# Patient Record
Sex: Female | Born: 1935 | Race: White | Hispanic: No | Marital: Single | Smoking: Never smoker
Health system: Southern US, Community
[De-identification: ages and names within clinical notes are randomized; demographics above are authoritative.]

## PROBLEM LIST (undated history)

## (undated) DIAGNOSIS — I152 Hypertension secondary to endocrine disorders: Secondary | ICD-10-CM

## (undated) DIAGNOSIS — I428 Other cardiomyopathies: Secondary | ICD-10-CM

## (undated) DIAGNOSIS — I5022 Chronic systolic (congestive) heart failure: Secondary | ICD-10-CM

## (undated) DIAGNOSIS — E119 Type 2 diabetes mellitus without complications: Secondary | ICD-10-CM

## (undated) DIAGNOSIS — E785 Hyperlipidemia, unspecified: Secondary | ICD-10-CM

## (undated) DIAGNOSIS — E1159 Type 2 diabetes mellitus with other circulatory complications: Secondary | ICD-10-CM

## (undated) DIAGNOSIS — E1169 Type 2 diabetes mellitus with other specified complication: Secondary | ICD-10-CM

## (undated) DIAGNOSIS — Z95810 Presence of automatic (implantable) cardiac defibrillator: Secondary | ICD-10-CM

## (undated) DIAGNOSIS — I447 Left bundle-branch block, unspecified: Secondary | ICD-10-CM

## (undated) HISTORY — PX: TOTAL ABDOMINAL HYSTERECTOMY W/ BILATERAL SALPINGOOPHORECTOMY: SHX83

---

## 2009-07-06 ENCOUNTER — Encounter: Admission: RE | Admit: 2009-07-06 | Discharge: 2009-07-06 | Payer: Self-pay | Admitting: Family Medicine

## 2010-07-11 ENCOUNTER — Encounter: Payer: Self-pay | Admitting: Cardiology

## 2010-07-11 NOTE — Telephone Encounter (Signed)
Error

## 2014-11-29 ENCOUNTER — Other Ambulatory Visit (HOSPITAL_COMMUNITY): Payer: Self-pay | Admitting: Physician Assistant

## 2014-11-29 DIAGNOSIS — R928 Other abnormal and inconclusive findings on diagnostic imaging of breast: Secondary | ICD-10-CM

## 2014-12-13 ENCOUNTER — Encounter (HOSPITAL_COMMUNITY): Payer: Self-pay

## 2014-12-27 ENCOUNTER — Ambulatory Visit (HOSPITAL_COMMUNITY)
Admission: RE | Admit: 2014-12-27 | Discharge: 2014-12-27 | Disposition: A | Discharge status: Home / Self Care | Payer: Medicare Other | Source: Ambulatory Visit | Attending: Physician Assistant | Admitting: Physician Assistant

## 2014-12-27 ENCOUNTER — Other Ambulatory Visit (HOSPITAL_COMMUNITY): Payer: Self-pay | Admitting: Physician Assistant

## 2014-12-27 DIAGNOSIS — N63 Unspecified lump in breast: Secondary | ICD-10-CM | POA: Insufficient documentation

## 2014-12-27 DIAGNOSIS — R928 Other abnormal and inconclusive findings on diagnostic imaging of breast: Secondary | ICD-10-CM

## 2014-12-27 DIAGNOSIS — N6320 Unspecified lump in the left breast, unspecified quadrant: Secondary | ICD-10-CM

## 2015-01-03 ENCOUNTER — Other Ambulatory Visit (HOSPITAL_COMMUNITY): Payer: Self-pay | Admitting: Family Medicine

## 2015-01-03 ENCOUNTER — Ambulatory Visit (HOSPITAL_COMMUNITY)
Admission: RE | Admit: 2015-01-03 | Discharge: 2015-01-03 | Disposition: A | Discharge status: Home / Self Care | Payer: Medicare Other | Source: Ambulatory Visit | Attending: Physician Assistant | Admitting: Physician Assistant

## 2015-01-03 ENCOUNTER — Ambulatory Visit (HOSPITAL_COMMUNITY)
Admission: RE | Admit: 2015-01-03 | Discharge: 2015-01-03 | Disposition: A | Discharge status: Home / Self Care | Payer: Medicare Other | Source: Ambulatory Visit | Attending: Family Medicine | Admitting: Family Medicine

## 2015-01-03 DIAGNOSIS — N6320 Unspecified lump in the left breast, unspecified quadrant: Secondary | ICD-10-CM

## 2015-01-03 DIAGNOSIS — C50412 Malignant neoplasm of upper-outer quadrant of left female breast: Secondary | ICD-10-CM | POA: Diagnosis not present

## 2015-01-03 DIAGNOSIS — Z170 Estrogen receptor positive status [ER+]: Secondary | ICD-10-CM | POA: Insufficient documentation

## 2015-01-03 DIAGNOSIS — N63 Unspecified lump in breast: Secondary | ICD-10-CM | POA: Diagnosis present

## 2015-01-03 DIAGNOSIS — R928 Other abnormal and inconclusive findings on diagnostic imaging of breast: Secondary | ICD-10-CM

## 2015-01-03 MED ORDER — LIDOCAINE HCL (PF) 2 % IJ SOLN
INTRAMUSCULAR | Status: AC
  Filled 2015-01-03: qty 10

## 2015-01-03 NOTE — Discharge Instructions (Signed)
Breast Biopsy, Care After °These instructions give you information on caring for yourself after your procedure. Your doctor may also give you more specific instructions. Call your doctor if you have any problems or questions after your procedure. °HOME CARE °· Only take medicine as told by your doctor. °· Do not take aspirin. °· Keep your sutures (stitches) dry when bathing. °· Protect the biopsy area. Do not let the area get bumped. °· Avoid activities that could pull the biopsy site open until your doctor approves. This includes: °· Stretching. °· Reaching. °· Exercise. °· Sports. °· Lifting more than 3lb. °· Continue your normal diet. °· Wear a good support bra for as long as told by your doctor. °· Change any bandages (dressings) as told by your doctor. °· Do not drink alcohol while taking pain medicine. °· Keep all doctor visits as told. Ask when your test results will be ready. Make sure you get your test results. °GET HELP RIGHT AWAY IF:  °· You have a fever. °· You have more bleeding (more than a small spot) from the biopsy site. °· You have trouble breathing. °· You have yellowish-white fluid (pus) coming from the biopsy site. °· You have redness, puffiness (swelling), or more pain in the biopsy site. °· You have a bad smell coming from the biopsy site. °· Your biopsy site opens after sutures, staples, or sticky strips have been removed. °· You have a rash. °· You need stronger medicine. °MAKE SURE YOU: °· Understand these instructions. °· Will watch your condition. °· Will get help right away if you are not doing well or get worse. °  °This information is not intended to replace advice given to you by your health care provider. Make sure you discuss any questions you have with your health care provider. °  °Document Released: 11/24/2008 Document Revised: 02/18/2014 Document Reviewed: 03/10/2011 °Elsevier Interactive Patient Education ©2016 Elsevier Inc. ° °Breast Biopsy °A breast biopsy is a test during  which a sample of tissue is taken from your breast. The breast tissue is looked at under a microscope for cancer cells.  °BEFORE THE PROCEDURE °· Make plans to have someone drive you home after the test. °· Do not smoke for 2 weeks before the test. Stop smoking, if you smoke. °· Do not drink alcohol for 24 hours before the test. °· Wear a good support bra to the test. °· Your doctor may do a procedure to place a wire or a seed that gives off radiation in the breast lump. The wire or seed will help your doctor see the lump when doing the biopsy. °PROCEDURE  °You may be given one of the following: °· A medicine to numb the breast area (local anesthetic). °· A medicine to make you fall asleep (general anesthetic). °There are different types of breast biopsies. They include: °· Fine-needle aspiration. °¨ A needle is put into the breast lump. °¨ The needle takes out fluid and cells from the lump. °¨ Ultrasound imaging may be used to help find the lump and to put the needle in the right spot. °· Core-needle biopsy. °¨ A needle is put into the breast lump. °¨ The needle is put in your breast 3-6 times. °¨ The needle removes breast tissue. °¨ An ultrasound image or X-ray is often used to find the right spot to put in the needle. °· Stereotactic biopsy. °¨ X-rays and a computer are used to study X-ray pictures of the breast lump. °¨ The computer finds   where the needle needs to be put into the breast. °¨ Tissue samples are taken out. °· Vacuum-assisted biopsy. °¨ A small cut (incision) is made in your breast. °¨ A biopsy device is put through the cut and into the breast tissue. °¨ The biopsy device draws abnormal breast tissue into the biopsy device. °¨ A large tissue sample is often removed. °¨ No stitches are needed. °· Ultrasound-guided core-needle biopsy. °¨ Ultrasound imaging helps guide the needle into the area of the breast that is not normal. °¨ A cut is made in the breast. The needle is put into the breast  lump. °¨ Tissue samples are taken out. °· Open biopsy. °¨ A large cut is made in the breast. °¨ Your doctor will try to remove the whole breast lump or as much as possible. °All tissue, fluid, or cell samples are looked at under a microscope.  °AFTER THE PROCEDURE °· You will be taken to an area to recover. You will be able to go home once you are doing well and are without problems. °· You may have bruising on your breast. This is normal. °· A pressure bandage (dressing) may be put on your breast for 24-48 hours. This type of bandage is wrapped tightly around your chest. It helps stop fluid from building up underneath tissues. °  °This information is not intended to replace advice given to you by your health care provider. Make sure you discuss any questions you have with your health care provider. °  °Document Released: 04/22/2011 Document Revised: 10/19/2014 Document Reviewed: 04/22/2011 °Elsevier Interactive Patient Education ©2016 Elsevier Inc. ° °

## 2019-03-09 ENCOUNTER — Other Ambulatory Visit (HOSPITAL_COMMUNITY): Payer: Self-pay | Admitting: Family

## 2019-03-09 DIAGNOSIS — Z1231 Encounter for screening mammogram for malignant neoplasm of breast: Secondary | ICD-10-CM

## 2019-05-12 ENCOUNTER — Ambulatory Visit (HOSPITAL_COMMUNITY): Payer: Medicare Other

## 2019-05-19 ENCOUNTER — Ambulatory Visit (HOSPITAL_COMMUNITY): Payer: Medicare Other

## 2019-09-07 ENCOUNTER — Telehealth: Payer: Self-pay | Admitting: Nurse Practitioner

## 2019-09-07 NOTE — Telephone Encounter (Signed)
Called patient's daughter [name], to schedule the Palliative Consult, no answer - left message with reason for call along with my name and contact number.

## 2019-09-10 ENCOUNTER — Telehealth: Payer: Self-pay | Admitting: Nurse Practitioner

## 2019-09-10 NOTE — Telephone Encounter (Signed)
Called patient's daughter, [name], to schedule the Palliative Consult, no answer and unable to leave message due to mailbox was full.  I then called patient's home number, no answer and it rang several times but no voicemail.

## 2019-09-16 ENCOUNTER — Telehealth: Payer: Self-pay | Admitting: Nurse Practitioner

## 2019-09-16 NOTE — Telephone Encounter (Signed)
I called patient's home number and was able to speak with the daughter [name], regarding Palliative services and all questions were answered and she was in agreement with beginning Palliative services for patient.  I have scheduled an In-person Consult for 10/05/19 @ 3:15 PM.

## 2019-09-16 NOTE — Telephone Encounter (Signed)
Rec;d message that daughter had called on 8/4 and requested that I call her back to schedule Palliative Consult.  Called daughter back with no answer and unable to leave a message due to mailbox was full.

## 2019-10-05 ENCOUNTER — Encounter: Payer: Self-pay | Admitting: Nurse Practitioner

## 2019-10-05 ENCOUNTER — Other Ambulatory Visit: Payer: Self-pay

## 2019-10-05 ENCOUNTER — Other Ambulatory Visit: Payer: Medicare Other | Admitting: Nurse Practitioner

## 2019-10-05 DIAGNOSIS — F0391 Unspecified dementia with behavioral disturbance: Secondary | ICD-10-CM

## 2019-10-05 DIAGNOSIS — Z515 Encounter for palliative care: Secondary | ICD-10-CM

## 2019-10-05 NOTE — Progress Notes (Signed)
[facility] Consult Note Telephone: [phone]  Fax: [phone]  PATIENT NAME: [name] DOB: 01-Mar-1935 MRN: [id number]  PRIMARY CARE PROVIDER:   Vesta Mixer  REFERRING PROVIDER:  Antionette Fairy, PA-C [address] [place],  [place] [address]  RESPONSIBLE PARTY:   Darielys Giglia daughter [phone] or [phone]  RECOMMENDATIONS and PLAN:  1. ACP: Made DNR/placed in Vynca; blank MOST form left for further review with family will revisit at next [facility] visit   2. Palliative care encounter; Palliative medicine team will continue to support patient, patient's family, and medical team. Visit consisted of counseling and education dealing with the complex and emotionally intense issues of symptom management and palliative care in the setting of serious and potentially life-threatening illness\  3. F/u 2 months for ongoing discussion goc, complete MOST form or sooner if family agree to complete, monitor progression of dementia  I spent 90 minutes providing this consultation,  from 3:00pmpm to 4:30pm. More than 50% of the time in this consultation was spent coordinating communication.   HISTORY OF PRESENT ILLNESS:  Kathryn Sloan is a 84 y.o. year old female with multiple medical problems including dementia, intraductal carcinoma. I was asked to see Kathryn Sloan for palliative care consult for goals of care. Initial in-person visit with Kathryn Sloan and her daughter [name] for and [facility] palliative care consult four goals of care. I met with Kathryn Sloan and [name]. We talked about purpose of palliative care visit. [name] in agreement. We talked about past medical history in the setting of chronic disease progression. We talked about Kathryn Sloan husband passing away several years ago for which he received CPR in the home that Ms Sloan witnessed as he was being admitted the hospice he was just discharged from the hospital. We talked about medical  goals of care including aggressive versus conservative versus comfort care. We reviewed most form and will leave a blank copy for [name] to review with her siblings. We talked about code status. Ms Halley endorses she would not want to have CPR. [name] in agreement. [place] form completed. Will put in Vynca/Epic. We talked about Life review. We talked about Ms Sloan working in [employer] in [place] bought her kids were in school. She had two girls and three boys. [name], her daughter who currently resides with her, a son play who lives in [place] and helps with all finances. Her other daughter lives in [place]. [name] youngest brother has passed. [name] other son [name] comes and goes though not reliable. We talked about Ms Sloan functional abilities. Kathryn Sloan is ambulatory with no recent fall. Kathryn Sloan does require assistance with bathing but is able to dress herself. Kathryn Sloan is incontinent of urine and wears adult diapers. [name] endorses that time she does have diarrhea for what she gives her one Lomotil which does control it are there has episodes. Ms Sloan does not appear to be in pain or short of breath. Kathryn Sloan does have cognitive impairment mild. We talked at length about behaviors. [name] endorses they were at the beach and she walked off it was very difficult to get her back in the car. [name] endorses an hour later she did not realize what happened. [name] endorses there were two other incidents where she got mad and left the house walking up the road. One time it took [place] 2 hours to get her out of the car. We talked about  the 25 mg of Seroquel that was started at bedtime. [name] endorses that it does make her sleepy but it seems to be improving behaviors. We talked about the option of possibly adding Seroquel 12.5 mg in the morning after a few weeks once she gets used to the Seroquel. We talked about [place] having a Psychiatric Nurse Practitioner and that it may  be wise to do a visit for behaviors with worsening dementia. We talked about chronic disease progression of dementia. We talked about each day being different. We talked about redirecting behaviors. We talked about coping strategies. We talked about caregiver stress and fatigue. We also talked about PACE program with Adult Day [facility] as well as [facility] for more information concerning their day programs. We talked about [place] for medical and financial. During the palliative care visit [name] text her brother in [place] and they were both in agreement that needs to be done. We talked about the dementia piece, before dementia progression so Kathryn Sloan can  decides who she wants for this role. We talked about role of palliative care and plan of care. We talked about palliative care for ongoing monitoring of dementia with slow progression, monitoring, , behaviors, supportive role.We talked about follow up palliative care visit into must have needed her sooner should she declined. [name] in agreement, appointment scheduled. Therapeutic listening, emotional support provided. They contact information. Questions answered to satisfaction.  Palliative Care was asked to help address goals of care.   CODE STATUS: DNR  PPS: 50% HOSPICE ELIGIBILITY/DIAGNOSIS: TBD  PAST MEDICAL HISTORY: Dementia, intraductal carcimoma Social History   Tobacco Use  . Smoking status: Not on file  Substance Use Topics  . Alcohol use: Not on file    ALLERGIES: No Known Allergies   PERTINENT MEDICATIONS:  No outpatient encounter medications on file as of 10/05/2019.   No facility-administered encounter medications on file as of 10/05/2019.    PHYSICAL EXAM:   General: NAD, mildly confused pleasant female Cardiovascular: regular rate and rhythm Pulmonary: clear ant fields Neurological: ambulatory  Ameliarose Shark Ihor Gully, NP

## 2019-10-20 ENCOUNTER — Telehealth: Payer: Self-pay

## 2019-10-20 NOTE — Telephone Encounter (Signed)
Palliative care volunteer support call made, patient doing ok

## 2019-11-09 ENCOUNTER — Other Ambulatory Visit: Payer: Self-pay

## 2019-11-09 ENCOUNTER — Other Ambulatory Visit: Payer: Medicare Other | Admitting: Nurse Practitioner

## 2019-11-09 ENCOUNTER — Telehealth: Payer: Self-pay | Admitting: Nurse Practitioner

## 2019-11-09 NOTE — Telephone Encounter (Signed)
I called [name], Ms. Tippy's daughter about f/u PC visit today, [name] returned my call. [name] endorses she forgot about the appointment plus she has covid, rescheduled per request. [name] endorses Ms. Flesch is doing well. Contact information

## 2019-12-11 ENCOUNTER — Emergency Department (HOSPITAL_COMMUNITY): Payer: Medicare Other

## 2019-12-11 ENCOUNTER — Emergency Department (HOSPITAL_COMMUNITY)
Admission: EM | Admit: 2019-12-11 | Discharge: 2019-12-11 | Disposition: A | Discharge status: Home / Self Care | Payer: Medicare Other | Attending: Emergency Medicine | Admitting: Emergency Medicine

## 2019-12-11 ENCOUNTER — Other Ambulatory Visit: Payer: Self-pay

## 2019-12-11 ENCOUNTER — Encounter (HOSPITAL_COMMUNITY): Payer: Self-pay | Admitting: Emergency Medicine

## 2019-12-11 DIAGNOSIS — E119 Type 2 diabetes mellitus without complications: Secondary | ICD-10-CM | POA: Insufficient documentation

## 2019-12-11 DIAGNOSIS — W010XXA Fall on same level from slipping, tripping and stumbling without subsequent striking against object, initial encounter: Secondary | ICD-10-CM | POA: Diagnosis not present

## 2019-12-11 DIAGNOSIS — S79911A Unspecified injury of right hip, initial encounter: Secondary | ICD-10-CM | POA: Diagnosis present

## 2019-12-11 DIAGNOSIS — S32511A Fracture of superior rim of right pubis, initial encounter for closed fracture: Secondary | ICD-10-CM | POA: Diagnosis not present

## 2019-12-11 DIAGNOSIS — S32591A Other specified fracture of right pubis, initial encounter for closed fracture: Secondary | ICD-10-CM

## 2019-12-11 HISTORY — DX: Type 2 diabetes mellitus without complications: E11.9

## 2019-12-11 MED ORDER — HYDROCODONE-ACETAMINOPHEN 5-325 MG PO TABS: ORAL_TABLET | ORAL | 0 refills | Status: DC

## 2019-12-11 NOTE — Discharge Instructions (Addendum)
The CT of your hip today shows that you have a nondisplaced fracture of your pelvic bone.  These areas should heal and several weeks.  She will need to use a walker for support when standing and walking.  She has been prescribed pain medication that should help with her discomfort, but be aware this may cause drowsiness and can cause constipation.  You may want to give her a stool softener while taking the hydrocodone.  Call her primary care provider to arrange follow-up or you may contact the orthopedic provider listed to arrange a follow-up in 1 to 2 weeks.  Return to the emergency department for any worsening symptoms.

## 2019-12-11 NOTE — ED Provider Notes (Signed)
[facility] EMERGENCY DEPARTMENT Provider Note   CSN: [id number] Arrival date & time: 12/11/19  1359     History Chief Complaint  Patient presents with  . Fall    Kathryn Sloan is a 84 y.o. female.  HPI      Kathryn Sloan is a 83 y.o. female with past medical history of type II DM who presents to the Emergency Department complaining of pain to her right hip and groin secondary to a fall that occurred 3 days ago.  She describes a mechanical fall in which her left foot slid forward and she fell back landing on her buttocks and right hip.  No proceeding symptoms.  She was seen at the emergency department in [place] and had an inconclusive plain film x-ray of her right hip and was recommended to have CT scan of her hip, but patient became impatient and left.  Patient and family member states that she continues to have pain that is sharp and severe with movement of her right hip and standing or walking.  She states the pain to her right groin is worse when stepping.  She denies head injury, back pain, LOC, headache, vomiting, visual changes, neck pain and numbness, or weakness of the right leg distal to the thigh.  No dysuria, hematuria, urine or bowel changes.  She has been taking extra strength Tylenol with some relief.  She denies excessive bruising, swelling or wounds of the hip or back.   Past Medical History:  Diagnosis Date  . Diabetes mellitus without complication ([place])     There are no problems to display for this patient.   History reviewed. No pertinent surgical history.   OB History   No obstetric history on file.     No family history on file.  Social History   Tobacco Use  . Smoking status: Not on file  Substance Use Topics  . Alcohol use: Not on file  . Drug use: Not on file    Home Medications Prior to Admission medications   Not on File    Allergies    Patient has no known allergies.  Review of Systems   Review of Systems    Constitutional: Negative for chills and fever.  Respiratory: Negative for chest tightness and shortness of breath.   Cardiovascular: Negative for chest pain and leg swelling.  Gastrointestinal: Negative for abdominal pain, nausea and vomiting.  Genitourinary: Negative for decreased urine volume, difficulty urinating, dysuria, flank pain and hematuria.  Musculoskeletal: Positive for arthralgias (right hip pain). Negative for joint swelling and neck pain.  Skin: Negative for color change and wound.  Neurological: Negative for dizziness, syncope, speech difficulty, weakness, numbness and headaches.    Physical Exam Updated Vital Signs BP 115/61 (BP Location: Right Arm)   Pulse 64   Temp 98.5 F (36.9 C) (Oral)   Resp 17   SpO2 94%   Physical Exam Vitals and nursing note reviewed.  Constitutional:      General: She is not in acute distress.    Appearance: Normal appearance. She is not ill-appearing.  HENT:     Head: Atraumatic.     Comments: No scalp tenderness or hematomas    Mouth/Throat:     Mouth: Mucous membranes are moist.  Eyes:     Conjunctiva/sclera: Conjunctivae normal.     Pupils: Pupils are equal, round, and reactive to light.  Cardiovascular:     Rate and Rhythm: Normal rate and regular rhythm.  Pulses: Normal pulses.  Pulmonary:     Effort: Pulmonary effort is normal.  Chest:     Chest wall: No tenderness.  Abdominal:     General: There is no distension.     Palpations: Abdomen is soft.     Tenderness: There is no abdominal tenderness. There is no left CVA tenderness.  Musculoskeletal:        General: Tenderness and signs of injury present. No swelling or deformity.     Cervical back: Normal range of motion. No tenderness.     Comments: Patient able to fully rotate the right hip w/o difficulty or pain.  No bony deformity or edema.  Few small area of ecchymosis of the right inner thigh.    Skin:    General: Skin is warm.     Capillary Refill: Capillary  refill takes less than 2 seconds.     Findings: No rash.  Neurological:     General: No focal deficit present.     Mental Status: She is alert.     Sensory: No sensory deficit.     Motor: No weakness.     ED Results / Procedures / Treatments   Labs (all labs ordered are listed, but only abnormal results are displayed) Labs Reviewed - No data to display  EKG None  Radiology CT Lumbar Spine Wo Contrast  Result Date: 12/11/2019 CLINICAL DATA:  Back pain.  Trauma 3 weeks ago. EXAM: CT LUMBAR SPINE WITHOUT CONTRAST TECHNIQUE: Multidetector CT imaging of the lumbar spine was performed without intravenous contrast administration. Multiplanar CT image reconstructions were also generated. COMPARISON:  None. FINDINGS: Segmentation: There are five lumbar type vertebral bodies. The last full intervertebral disc space is labeled L5-S1. Alignment: Grade 1 spondylolisthesis at L5 due to bilateral pars defects. There is also degenerative anterolisthesis of L4. Mild posterior listhesis of L1. Vertebrae: Age related osteoporosis but no acute fracture or worrisome bone lesions. Paraspinal and other soft tissues: Advanced aortic calcifications but no aneurysm. No retroperitoneal mass or adenopathy. Disc levels: T12-L1: No significant findings. L1-2: Bulging annulus and moderate facet disease but no significant spinal or foraminal stenosis. L2-3: Bulging annulus and advanced facet disease contributing to mild spinal and bilateral lateral recess stenosis. No significant foraminal stenosis. L3-4: Right-sided laminectomy defect is noted. Diffuse bulging annulus and advanced facet disease but no significant spinal or foraminal stenosis. L4-5: Advanced disc disease and facet disease without significant spinal or foraminal stenosis. L5-S1: Bulging uncovered disc and severe facet disease. Mild spinal, lateral recess and mild to moderate foraminal stenosis bilaterally. IMPRESSION: 1. Grade 1 spondylolisthesis at L5 due to  bilateral pars defects. There is also degenerative anterolisthesis of L4. 2. Mild spinal, lateral recess and mild to moderate foraminal stenosis bilaterally at L5-S1. 3. Mild spinal and bilateral lateral recess stenosis at L2-3. 4. Right-sided laminectomy defect at L3-4. 5. Aortic atherosclerosis. Aortic Atherosclerosis (ICD10-I70.0). Electronically Signed   By: Marijo Sanes M.D.   On: 12/11/2019 15:54   CT Hip Right Wo Contrast  Result Date: 12/11/2019 CLINICAL DATA:  Right hip pain for 3 weeks.  History of trauma. EXAM: CT OF THE RIGHT HIP WITHOUT CONTRAST TECHNIQUE: Multidetector CT imaging of the right hip was performed according to the standard protocol. Multiplanar CT image reconstructions were also generated. COMPARISON:  None. FINDINGS: The right hip is normally located. No hip fracture or evidence of AVN. Mild to moderate hip joint degenerative changes. There are nondisplaced fractures involving the superior and inferior pubic rami. Moderate degenerative changes  at the pubic symphysis. Enthesophytes noted at the ischial tuberosity and greater trochanter. The hip and pelvic musculature is grossly normal. No obvious muscle tear or intramuscular hematoma. Age related atherosclerotic calcifications are noted. No significant intrapelvic abnormalities are identified. IMPRESSION: 1. Nondisplaced fractures involving the superior and inferior pubic rami. 2. No hip fracture or evidence of AVN. 3. Mild to moderate hip joint degenerative changes. Electronically Signed   By: Marijo Sanes M.D.   On: 12/11/2019 15:48    Procedures Procedures (including critical care time)  Medications Ordered in ED Medications - No data to display  ED Course  I have reviewed the triage vital signs and the nursing notes.  Pertinent labs & imaging results that were available during my care of the patient were reviewed by me and considered in my medical decision making (see chart for details).    MDM  Rules/Calculators/A&P                           Patient here for CT of the right hip secondary to pain of her right hip and groin from a mechanical fall that occurred 3 days ago.  No head injury or LOC. Denies any other injuries.  Accompanied by her daughter who is also her caregiver and lives with her mother.  She was initially seen after the fall at another ER but became upset over the wait time, and left prior to receiving the CT of her hip.  Describes pain associated only with standing, walking.  Resolves at rest.    On my exam, she has full ROM of the right hip.  No bony deformities or edema.  NV intact and she is able to bear weight and make small steps.  CT of the hip shows nondisplaced inferior and superior pubic rami fx's.  Dicussed these finding with pt and family.  Since pt is able to bear wt, has walker at home and has family member at home that care for her, I feel that pt is appropriate for d/c home.  Pt agreeable to close orthopedic f/u, prefers local ortho f/u.  return precautions given.     Final Clinical Impression(s) / ED Diagnoses Final diagnoses:  Closed fracture of right inferior pubic ramus, initial encounter ([place])  Closed fracture of superior ramus of right pubis, initial encounter [facility])    Rx / [place] Orders ED Discharge Orders    None       Kem Parkinson, PA-C 12/13/19 1411    Sherwood Gambler, MD 12/15/19 [phone]

## 2019-12-11 NOTE — ED Triage Notes (Signed)
Pt reports she fell flat on her back Wednesday. Denies hitting her head. Daughter reports that pt was seen in [place] and had an x-ray on her right hip but was told they needed a CT scan. Pt still reports pain to the right upper leg/hip.

## 2019-12-19 ENCOUNTER — Observation Stay (HOSPITAL_BASED_OUTPATIENT_CLINIC_OR_DEPARTMENT_OTHER)
Admission: EM | Admit: 2019-12-19 | Discharge: 2019-12-20 | Disposition: A | Discharge status: Home / Self Care | Payer: Medicare Other | Source: Home / Self Care | Attending: Emergency Medicine | Admitting: Emergency Medicine

## 2019-12-19 ENCOUNTER — Other Ambulatory Visit: Payer: Self-pay

## 2019-12-19 ENCOUNTER — Emergency Department (HOSPITAL_COMMUNITY): Payer: Medicare Other

## 2019-12-19 ENCOUNTER — Encounter (HOSPITAL_COMMUNITY): Payer: Self-pay

## 2019-12-19 DIAGNOSIS — J189 Pneumonia, unspecified organism: Secondary | ICD-10-CM | POA: Diagnosis not present

## 2019-12-19 DIAGNOSIS — R042 Hemoptysis: Secondary | ICD-10-CM | POA: Diagnosis present

## 2019-12-19 DIAGNOSIS — Z794 Long term (current) use of insulin: Secondary | ICD-10-CM | POA: Insufficient documentation

## 2019-12-19 DIAGNOSIS — F0390 Unspecified dementia without behavioral disturbance: Secondary | ICD-10-CM | POA: Insufficient documentation

## 2019-12-19 DIAGNOSIS — J9601 Acute respiratory failure with hypoxia: Secondary | ICD-10-CM | POA: Diagnosis not present

## 2019-12-19 DIAGNOSIS — Z7984 Long term (current) use of oral hypoglycemic drugs: Secondary | ICD-10-CM | POA: Insufficient documentation

## 2019-12-19 DIAGNOSIS — R791 Abnormal coagulation profile: Secondary | ICD-10-CM | POA: Insufficient documentation

## 2019-12-19 DIAGNOSIS — Z7982 Long term (current) use of aspirin: Secondary | ICD-10-CM | POA: Insufficient documentation

## 2019-12-19 DIAGNOSIS — R102 Pelvic and perineal pain: Secondary | ICD-10-CM

## 2019-12-19 DIAGNOSIS — E119 Type 2 diabetes mellitus without complications: Secondary | ICD-10-CM | POA: Insufficient documentation

## 2019-12-19 DIAGNOSIS — I4891 Unspecified atrial fibrillation: Secondary | ICD-10-CM | POA: Insufficient documentation

## 2019-12-19 DIAGNOSIS — Z7901 Long term (current) use of anticoagulants: Secondary | ICD-10-CM | POA: Insufficient documentation

## 2019-12-19 DIAGNOSIS — Z20822 Contact with and (suspected) exposure to covid-19: Secondary | ICD-10-CM | POA: Insufficient documentation

## 2019-12-19 LAB — COMPREHENSIVE METABOLIC PANEL
ALT: 23 U/L (ref 0–44)
AST: 23 U/L (ref 15–41)
Albumin: 3.2 g/dL — ABNORMAL LOW (ref 3.5–5.0)
Alkaline Phosphatase: 361 U/L — ABNORMAL HIGH (ref 38–126)
Anion gap: 9 (ref 5–15)
BUN: 36 mg/dL — ABNORMAL HIGH (ref 8–23)
CO2: 22 mmol/L (ref 22–32)
Calcium: 9 mg/dL (ref 8.9–10.3)
Chloride: 104 mmol/L (ref 98–111)
Creatinine, Ser: 1.04 mg/dL — ABNORMAL HIGH (ref 0.44–1.00)
GFR, Estimated: 54 mL/min — ABNORMAL LOW (ref >60)
Glucose, Bld: 185 mg/dL — ABNORMAL HIGH (ref 70–99)
Potassium: 3.9 mmol/L (ref 3.5–5.1)
Sodium: 135 mmol/L (ref 135–145)
Total Bilirubin: 1.7 mg/dL — ABNORMAL HIGH (ref 0.3–1.2)
Total Protein: 6.6 g/dL (ref 6.5–8.1)

## 2019-12-19 LAB — CBC WITH DIFFERENTIAL/PLATELET
Abs Immature Granulocytes: 0.06 10*3/uL (ref 0.00–0.07)
Basophils Absolute: 0 10*3/uL (ref 0.0–0.1)
Basophils Relative: 0 %
Eosinophils Absolute: 0.1 10*3/uL (ref 0.0–0.5)
Eosinophils Relative: 1 %
HCT: 35.6 % — ABNORMAL LOW (ref 36.0–46.0)
Hemoglobin: 11.7 g/dL — ABNORMAL LOW (ref 12.0–15.0)
Immature Granulocytes: 1 %
Lymphocytes Relative: 9 %
Lymphs Abs: 1.2 10*3/uL (ref 0.7–4.0)
MCH: 29.9 pg (ref 26.0–34.0)
MCHC: 32.9 g/dL (ref 30.0–36.0)
MCV: 91 fL (ref 80.0–100.0)
Monocytes Absolute: 0.9 10*3/uL (ref 0.1–1.0)
Monocytes Relative: 7 %
Neutro Abs: 10.1 10*3/uL — ABNORMAL HIGH (ref 1.7–7.7)
Neutrophils Relative %: 82 %
Platelets: 236 10*3/uL (ref 150–400)
RBC: 3.91 MIL/uL (ref 3.87–5.11)
RDW: 14.5 % (ref 11.5–15.5)
WBC: 12.4 10*3/uL — ABNORMAL HIGH (ref 4.0–10.5)
nRBC: 0 % (ref 0.0–0.2)

## 2019-12-19 LAB — PROTIME-INR
INR: >10 (ref 0.8–1.2)
Prothrombin Time: >90 seconds — ABNORMAL HIGH (ref 11.4–15.2)

## 2019-12-19 LAB — RESPIRATORY PANEL BY RT PCR (FLU A&B, COVID)
Influenza A by PCR: NEGATIVE
Influenza B by PCR: NEGATIVE
SARS Coronavirus 2 by RT PCR: NEGATIVE

## 2019-12-19 MED ORDER — HYDROCODONE-ACETAMINOPHEN 5-325 MG PO TABS
1.0000 | ORAL_TABLET | Freq: Four times a day (QID) | ORAL | Status: DC | PRN
  Administered 2019-12-20: 1 via ORAL
  Filled 2019-12-19: qty 1

## 2019-12-19 MED ORDER — ACETAMINOPHEN 650 MG RE SUPP: 650.0000 mg | Freq: Four times a day (QID) | RECTAL | Status: DC | PRN

## 2019-12-19 MED ORDER — ONDANSETRON HCL 4 MG PO TABS: 4.0000 mg | ORAL_TABLET | Freq: Four times a day (QID) | ORAL | Status: DC | PRN

## 2019-12-19 MED ORDER — SODIUM CHLORIDE 0.9% FLUSH: 3.0000 mL | INTRAVENOUS | Status: DC | PRN

## 2019-12-19 MED ORDER — SODIUM CHLORIDE 0.9 % IV SOLN: 250.0000 mL | INTRAVENOUS | Status: DC | PRN

## 2019-12-19 MED ORDER — ONDANSETRON HCL 4 MG/2ML IJ SOLN: 4.0000 mg | Freq: Four times a day (QID) | INTRAMUSCULAR | Status: DC | PRN

## 2019-12-19 MED ORDER — QUETIAPINE FUMARATE 25 MG PO TABS
25.0000 mg | ORAL_TABLET | Freq: Once | ORAL | Status: AC
  Administered 2019-12-19: 25 mg via ORAL
  Filled 2019-12-19: qty 1

## 2019-12-19 MED ORDER — VITAMIN K1 10 MG/ML IJ SOLN
2.5000 mg | Freq: Once | INTRAVENOUS | Status: AC
  Administered 2019-12-19: 2.5 mg via INTRAVENOUS
  Filled 2019-12-19: qty 0.25

## 2019-12-19 MED ORDER — ACETAMINOPHEN 325 MG PO TABS: 650.0000 mg | ORAL_TABLET | Freq: Four times a day (QID) | ORAL | Status: DC | PRN

## 2019-12-19 MED ORDER — SODIUM CHLORIDE 0.9% FLUSH
3.0000 mL | Freq: Two times a day (BID) | INTRAVENOUS | Status: DC
  Administered 2019-12-20: 3 mL via INTRAVENOUS

## 2019-12-19 MED ORDER — SODIUM CHLORIDE 0.9 % IV BOLUS
500.0000 mL | Freq: Once | INTRAVENOUS | Status: AC
  Administered 2019-12-19: 500 mL via INTRAVENOUS

## 2019-12-19 NOTE — ED Provider Notes (Signed)
Emergency Department Provider Note   I have reviewed the triage vital signs and the nursing notes.   HISTORY  Chief Complaint Hip Pain   HPI Kathryn Sloan is a 84 y.o. female with past medical history reviewed below including dementia presents emergency department with continued pelvic pain in the setting of known pubic ramus fracture and new onset hemoptysis starting this morning.  Patient has brought into the emergency department by her daughter who is her primary caregiver.  The patient has underlying dementia and cannot provide a significant history.  Level 5 caveat applies with dementia.  The daughter states that since the patient's fall in late October she has been very hesitant to get up and walk due to pain.  The daughter assists her out of the bed and helps her to bathe and go to the bathroom.  She has not had fevers, chills, vomiting.  She did have a bowel movement last yesterday but until that time it been constipated.  She has a palliative nurse who is due to arrive on Tuesday for an assessment and possible home health but this morning patient developed some hemoptysis.  The daughter has noticed some wet sounding cough over the past couple of days.  The patient does take Coumadin with history of atrial fibrillation.  She has not seemed short of breath.  The daughter gave 2 nebulizer treatments in the past 24 hours but the hemoptysis has continued.  The patient denies chest pain or feeling short of breath to me but does complain of the lingering cough. No new falls or injury. Patient denies any numbness/tingling.    Past Medical History:  Diagnosis Date  . Diabetes mellitus without complication [facility])     Patient Active Problem List   Diagnosis Date Noted  . Cough with hemoptysis 12/19/2019    History reviewed. No pertinent surgical history.  Allergies Patient has no known allergies.  No family history on file.  Social History Social History   Tobacco Use  .  Smoking status: Never Smoker  . Smokeless tobacco: Never Used  Substance Use Topics  . Alcohol use: Never  . Drug use: Never    Review of Systems  Constitutional: No fever/chills Eyes: No visual changes. ENT: No sore throat. Cardiovascular: Denies chest pain. Respiratory: Denies shortness of breath. Positive hemoptysis and cough.  Gastrointestinal: No abdominal pain.  No nausea, no vomiting.  No diarrhea.  No constipation. Genitourinary: Negative for dysuria. Musculoskeletal: Positive pelvis/hip pain worse with movement.  Skin: Negative for rash. Neurological: Negative for headaches, focal weakness or numbness.  10-point ROS otherwise negative.  ____________________________________________   PHYSICAL EXAM:  VITAL SIGNS: ED Triage Vitals  Enc Vitals Group     BP --      Pulse Rate 12/19/19 1141 73     Resp 12/19/19 1141 18     Temp 12/19/19 1141 97.9 F (36.6 C)     Temp Source 12/19/19 1141 Oral     SpO2 12/19/19 1141 (!) 89 %     Weight 12/19/19 1139 182 lb (82.6 kg)   Constitutional: Alert and oriented. Well appearing and in no acute distress. Eyes: Conjunctivae are normal. Head: Atraumatic. Nose: No congestion/rhinnorhea. Mouth/Throat: Mucous membranes are moist.  Neck: No stridor.  No cervical spine tenderness to palpation. Cardiovascular: Normal rate, regular rhythm. Good peripheral circulation. Grossly normal heart sounds.   Respiratory: Normal respiratory effort.  No retractions. Lungs with some mild rhonchi at the bases. No wheezing.  Gastrointestinal: Soft and  nontender. No distention.  Musculoskeletal: Pain with passive ROM of both hips but ROM not specifically limited. No midline lumbar or thoracic spine tenderness.  Neurologic:  Normal speech and language. No gross focal neurologic deficits are appreciated.  Skin:  Skin is warm, dry and intact. No rash noted.   ____________________________________________   LABS (all labs ordered are listed, but  only abnormal results are displayed)  Labs Reviewed  COMPREHENSIVE METABOLIC PANEL - Abnormal; Notable for the following components:      Result Value   Glucose, Bld 185 (*)    BUN 36 (*)    Creatinine, Ser 1.04 (*)    Albumin 3.2 (*)    Alkaline Phosphatase 361 (*)    Total Bilirubin 1.7 (*)    GFR, Estimated 54 (*)    All other components within normal limits  CBC WITH DIFFERENTIAL/PLATELET - Abnormal; Notable for the following components:   WBC 12.4 (*)    Hemoglobin 11.7 (*)    HCT 35.6 (*)    Neutro Abs 10.1 (*)    All other components within normal limits  PROTIME-INR - Abnormal; Notable for the following components:   Prothrombin Time >90.0 (*)    INR >10.0 (*)    All other components within normal limits  PROTIME-INR - Abnormal; Notable for the following components:   Prothrombin Time 22.4 (*)    INR 2.0 (*)    All other components within normal limits  CBC - Abnormal; Notable for the following components:   WBC 13.9 (*)    RBC 3.73 (*)    Hemoglobin 11.1 (*)    HCT 34.8 (*)    All other components within normal limits  RESPIRATORY PANEL BY RT PCR (FLU A&B, COVID)  BASIC METABOLIC PANEL   ____________________________________________  RADIOLOGY  CXR reviewed.  ____________________________________________   PROCEDURES  Procedure(s) performed:   Procedures  None  ____________________________________________   INITIAL IMPRESSION / ASSESSMENT AND PLAN / ED COURSE  Pertinent labs & imaging results that were available during my care of the patient were reviewed by me and considered in my medical decision making (see chart for details).   Patient presents to the emergency department for evaluation of continued pelvic pain in the setting of known pubic rami fractures.  Plan for repeat plain films of the bilateral hips and pelvis to rule out new injury but low suspicion for this clinically.  Patient also with some hemoptysis at home.  Sats on arrival of 89% but  no acute respiratory distress.  Plan for respiratory panel, chest x-ray, labs including INR.   INR > 10. No hypoxemia. CXR reviewed. Some continued cough here but no large volume hemoptysis. Plan for admit. Vit K given IV.   Discussed patient's case with [facility] to request admission. Patient and family (if present) updated with plan. Care transferred to [facility] service.  I reviewed all nursing notes, vitals, pertinent old records, EKGs, labs, imaging (as available).  ____________________________________________  FINAL CLINICAL IMPRESSION(S) / ED DIAGNOSES  Final diagnoses:  Hemoptysis  Elevated INR  Pelvic pain     MEDICATIONS GIVEN DURING THIS VISIT:  Medications  HYDROcodone-acetaminophen (NORCO/VICODIN) 5-325 MG per tablet 1 tablet (1 tablet Oral Given 12/20/19 1329)  sodium chloride flush (NS) 0.9 % injection 3 mL (3 mLs Intravenous Given 12/20/19 1334)  sodium chloride flush (NS) 0.9 % injection 3 mL (has no administration in time range)  0.9 %  sodium chloride infusion (has no administration in time range)  acetaminophen (TYLENOL) tablet 650  mg (has no administration in time range)    Or  acetaminophen (TYLENOL) suppository 650 mg (has no administration in time range)  ondansetron (ZOFRAN) tablet 4 mg (has no administration in time range)    Or  ondansetron (ZOFRAN) injection 4 mg (has no administration in time range)  sodium chloride 0.9 % bolus 500 mL (0 mLs Intravenous Stopped 12/19/19 1356)  phytonadione (VITAMIN K) 2.5 mg in dextrose 5 % 50 mL IVPB ( Intravenous Stopped 12/19/19 1621)  QUEtiapine (SEROQUEL) tablet 25 mg (25 mg Oral Given 12/19/19 2219)    Note:  This document was prepared using Dragon voice recognition software and may include unintentional dictation errors.  Nanda Quinton, MD, [facility] Emergency Medicine    Denica Web, [facility], MD 12/20/19 [phone]

## 2019-12-19 NOTE — H&P (Signed)
History and Physical    Kathryn Sloan GXQ:[id number] DOB: 11-14-1935 DOA: 12/19/2019  PCP: [facility]   Patient coming from: Home  Chief Complaint: Hemoptysis  HPI: Kathryn Sloan is a 84 y.o. female with medical history significant for dementia, atrial fibrillation on Coumadin, and recent pubic rami fracture who presented to the ED with new onset hemoptysis that began earlier this morning.  She was noted to have a wet sounding cough over the past couple days and does take Coumadin for atrial fibrillation.  She has not been otherwise short of breath and has not had any fever or chills or chest pain.  No lower extremity edema otherwise noted.  Patient was brought in by her daughter who is the primary caregiver as patient cannot give history due to her underlying dementia.  Apparently the patient has been hesitant to walk due to pain and the daughter has to assist with her activities of daily living.  According to the ED provider note, palliative nurse is due to arrive on Tuesday for an assessment for home health.   ED Course: Hemoglobin level noted to be 11.7 with no prior baseline in the system as patient is from [place].  INR is greater than 10 WBC count 12.4.  Chest x-ray with no acute findings and pelvic films with no new findings noted.  Creatinine 1.04.  She was given IV vitamin K 2.5 mg in the ED.  She appears comfortable otherwise and is not requiring oxygen.  There is no family currently at bedside.  Review of Systems: Cannot be adequately obtained given patient condition.  Past Medical History:  Diagnosis Date  . Diabetes mellitus without complication ([place])     History reviewed. No pertinent surgical history.   reports that she has never smoked. She has never used smokeless tobacco. She reports that she does not drink alcohol and does not use drugs.  No Known Allergies  No family history on file.  Prior to Admission medications   Medication Sig  Start Date End Date Taking? Authorizing Provider  HYDROcodone-acetaminophen (NORCO/VICODIN) 5-325 MG tablet Take one tab po q 4 hrs prn pain 12/11/19   Kem Parkinson, PA-C    Physical Exam: Vitals:   12/19/19 1330 12/19/19 1400 12/19/19 1430 12/19/19 1500  BP: (!) 113/52 106/67 (!) 108/57 (!) 103/53  Pulse: 66     Resp: (!) 27 20 (!) 22 (!) 25  Temp:      TempSrc:      SpO2: 93%     Weight:        Constitutional: NAD, calm, comfortable, pleasantly confused Vitals:   12/19/19 1330 12/19/19 1400 12/19/19 1430 12/19/19 1500  BP: (!) 113/52 106/67 (!) 108/57 (!) 103/53  Pulse: 66     Resp: (!) 27 20 (!) 22 (!) 25  Temp:      TempSrc:      SpO2: 93%     Weight:       Eyes: lids and conjunctivae normal ENMT: Mucous membranes are moist.  Neck: normal, supple Respiratory: clear to auscultation bilaterally. Normal respiratory effort. No accessory muscle use.  Cardiovascular: Regular rate and rhythm, no murmurs. No extremity edema. Abdomen: no tenderness, no distention. Bowel sounds positive.  Musculoskeletal:  No joint deformity upper and lower extremities.   Skin: no rashes, lesions, ulcers.  Psychiatric: Alert and awake  Labs on Admission: I have personally reviewed following labs and imaging studies  CBC: Recent Labs  Lab 12/19/19 1235  WBC 12.4*  NEUTROABS 10.1*  HGB 11.7*  HCT 35.6*  MCV 91.0  PLT 024   Basic Metabolic Panel: Recent Labs  Lab 12/19/19 1235  NA 135  K 3.9  CL 104  CO2 22  GLUCOSE 185*  BUN 36*  CREATININE 1.04*  CALCIUM 9.0   GFR: CrCl cannot be calculated (Unknown ideal weight.). Liver Function Tests: Recent Labs  Lab 12/19/19 1235  AST 23  ALT 23  ALKPHOS 361*  BILITOT 1.7*  PROT 6.6  ALBUMIN 3.2*   No results for input(s): LIPASE, AMYLASE in the last 168 hours. No results for input(s): AMMONIA in the last 168 hours. Coagulation Profile: Recent Labs  Lab 12/19/19 1235  INR >10.0*   Cardiac Enzymes: No results for  input(s): CKTOTAL, CKMB, CKMBINDEX, TROPONINI in the last 168 hours. BNP (last 3 results) No results for input(s): PROBNP in the last 8760 hours. HbA1C: No results for input(s): HGBA1C in the last 72 hours. CBG: No results for input(s): GLUCAP in the last 168 hours. Lipid Profile: No results for input(s): CHOL, HDL, LDLCALC, TRIG, CHOLHDL, LDLDIRECT in the last 72 hours. Thyroid Function Tests: No results for input(s): TSH, T4TOTAL, FREET4, T3FREE, THYROIDAB in the last 72 hours. Anemia Panel: No results for input(s): VITAMINB12, FOLATE, FERRITIN, TIBC, IRON, RETICCTPCT in the last 72 hours. Urine analysis: No results found for: COLORURINE, APPEARANCEUR, LABSPEC, [place], GLUCOSEU, [place], BILIRUBINUR, KETONESUR, PROTEINUR, UROBILINOGEN, NITRITE, LEUKOCYTESUR  Radiological Exams on Admission: DG Chest Portable 1 View  Result Date: 12/19/2019 CLINICAL DATA:  Began spitting up blood today, diabetes mellitus EXAM: PORTABLE CHEST 1 VIEW COMPARISON:  Portable exam 1305 hours without priors for comparison FINDINGS: LEFT subclavian AICD with leads projecting over RIGHT atrium, RIGHT ventricle, and coronary sinus. Upper normal heart size. Mediastinal contours and pulmonary vascularity normal. Atherosclerotic calcification aorta. Marked elevation of RIGHT diaphragm with RIGHT basilar atelectasis. Peribronchial thickening with minimal LEFT basilar atelectasis. No acute infiltrate, pleural effusion, or pneumothorax. Bones demineralized. IMPRESSION: Bronchitic changes with bibasilar atelectasis greater on RIGHT. Electronically Signed   By: Lavonia Dana M.D.   On: 12/19/2019 13:57   DG Hip Unilat W or Wo Pelvis 2-3 Views Left  Result Date: 12/19/2019 CLINICAL DATA:  BILATERAL hip pain, known RIGHT pubic ramus fractures 10 days ago, question new injury EXAM: DG HIP (WITH OR WITHOUT PELVIS) 2-3V LEFT COMPARISON:  None FINDINGS: Osseous demineralization. LEFT hip and SI joint spaces preserved. Fractures of  RIGHT superior inferior pubic rami identified at margin of exam. No acute fracture, dislocation, or bone destruction. IMPRESSION: No acute osseous abnormalities. Electronically Signed   By: Lavonia Dana M.D.   On: 12/19/2019 13:59   DG Hip Unilat W or Wo Pelvis 2-3 Views Right  Result Date: 12/19/2019 CLINICAL DATA:  Known RIGHT pubic ramus fracture 10 days ago, BILATERAL hip pain, screening for new injury, began spitting up blood today, diabetes mellitus EXAM: DG HIP (WITH OR WITHOUT PELVIS) 2-3V RIGHT COMPARISON:  CT RIGHT hip 12/11/2019 FINDINGS: Osseous demineralization. Hip and SI joint spaces preserved. Again identified fractures of the RIGHT superior and inferior pubic rami. No new fracture, dislocation, or bone destruction. Enthesopathy at RIGHT greater trochanter. Atherosclerotic calcifications in pelvis. IMPRESSION: Again identified fractures of the RIGHT inferior and superior pubic rami. No new osseous abnormalities. Electronically Signed   By: Lavonia Dana M.D.   On: 12/19/2019 13:56    Assessment/Plan Active Problems:   Cough with hemoptysis    Hemoptysis likely secondary to supratherapeutic INR -IV vitamin K  given in ED -Monitor for further hemoptysis overnight -Recheck hemoglobin and hematocrit in a.m. as well as INR levels  Atrial fibrillation -Currently rate controlled -Patient will need closer monitoring of anticoagulation at home once able to resume  History of dementia -Poor functional status noted overall -Appears that home health services will be assisting in care  Recent pubic rami fracture -Nonoperative management   DVT prophylaxis: SCDs Code Status: Full code Family Communication: None at bedside; tried calling with no response Disposition Plan:Obs for hemoptysis Consults called:None Admission status: Obs, MedSurg   Kavaughn Faucett D Manuella Ghazi DO [facility]  If 7PM-7AM, please contact night-coverage [email or web address]  12/19/2019, 3:02 PM

## 2019-12-19 NOTE — ED Notes (Signed)
Pt continues to remove equipment and oxygen. Daughter informed that if she does not assist and keep equipment on pt that mittens will be applied. Daughter stated that mittens will be okay to be applied. Mittens applied.

## 2019-12-19 NOTE — ED Triage Notes (Signed)
Pt here recently for Closed fracture of right inferior pubic ramus.  Daughter reports pt c/o pain.  ALso reports pt coughing up blood this morning.

## 2019-12-20 ENCOUNTER — Other Ambulatory Visit: Payer: Medicare Other | Admitting: Nurse Practitioner

## 2019-12-20 DIAGNOSIS — R042 Hemoptysis: Secondary | ICD-10-CM | POA: Diagnosis not present

## 2019-12-20 LAB — BASIC METABOLIC PANEL
Anion gap: 10 (ref 5–15)
BUN: 27 mg/dL — ABNORMAL HIGH (ref 8–23)
CO2: 22 mmol/L (ref 22–32)
Calcium: 9.1 mg/dL (ref 8.9–10.3)
Chloride: 105 mmol/L (ref 98–111)
Creatinine, Ser: 0.75 mg/dL (ref 0.44–1.00)
GFR, Estimated: >60 mL/min (ref >60)
Glucose, Bld: 196 mg/dL — ABNORMAL HIGH (ref 70–99)
Potassium: 3.9 mmol/L (ref 3.5–5.1)
Sodium: 137 mmol/L (ref 135–145)

## 2019-12-20 LAB — CBC
HCT: 34.8 % — ABNORMAL LOW (ref 36.0–46.0)
Hemoglobin: 11.1 g/dL — ABNORMAL LOW (ref 12.0–15.0)
MCH: 29.8 pg (ref 26.0–34.0)
MCHC: 31.9 g/dL (ref 30.0–36.0)
MCV: 93.3 fL (ref 80.0–100.0)
Platelets: 272 10*3/uL (ref 150–400)
RBC: 3.73 MIL/uL — ABNORMAL LOW (ref 3.87–5.11)
RDW: 14.5 % (ref 11.5–15.5)
WBC: 13.9 10*3/uL — ABNORMAL HIGH (ref 4.0–10.5)
nRBC: 0 % (ref 0.0–0.2)

## 2019-12-20 LAB — PROTIME-INR
INR: 2 — ABNORMAL HIGH (ref 0.8–1.2)
Prothrombin Time: 22.4 seconds — ABNORMAL HIGH (ref 11.4–15.2)

## 2019-12-20 MED ORDER — HYDROCODONE-ACETAMINOPHEN 5-325 MG PO TABS: 1.0000 | ORAL_TABLET | Freq: Four times a day (QID) | ORAL | 0 refills | Status: AC | PRN

## 2019-12-20 NOTE — Discharge Summary (Addendum)
Physician Discharge Summary  Kathryn Sloan:[id number] DOB: 1935-08-30 DOA: 12/19/2019  PCP: [facility] date: 12/19/2019  Discharge date: 12/20/2019  Admitted From:Home  Disposition:  Home  Recommendations for Outpatient Follow-up:  1. Follow up with PCP in 1-2 weeks 2. Continue to remain off Coumadin until hemoptysis fully resolves 3. Follow-up for further management per PCP/cardiologist 4. Continue other home medications as prior 5. Patient to have palliative care follow-up on 11/9 for further pain management  Home Health: Yes with palliative care already established  Equipment/Devices: None  Discharge Condition: Stable  CODE STATUS: Full  Diet recommendation: Heart Healthy  Brief/Interim Summary: Per HPI: Kathryn Sloan is a 84 y.o. female with medical history significant for dementia, atrial fibrillation on Coumadin, and recent pubic rami fracture who presented to the ED with new onset hemoptysis that began earlier this morning.  She was noted to have a wet sounding cough over the past couple days and does take Coumadin for atrial fibrillation.  She has not been otherwise short of breath and has not had any fever or chills or chest pain.  No lower extremity edema otherwise noted.  Patient was brought in by her daughter who is the primary caregiver as patient cannot give history due to her underlying dementia.  Apparently the patient has been hesitant to walk due to pain and the daughter has to assist with her activities of daily living.  According to the ED provider note, palliative nurse is due to arrive on Tuesday for an assessment for home health.   ED Course: Hemoglobin level noted to be 11.7 with no prior baseline in the system as patient is from [place].  INR is greater than 10 WBC count 12.4.  Chest x-ray with no acute findings and pelvic films with no new findings noted.  Creatinine 1.04.  She was given IV vitamin K 2.5 mg in the ED.   She appears comfortable otherwise and is not requiring oxygen.  There is no family currently at bedside.  -Patient was admitted with hemoptysis in the setting of supratherapeutic INR.  She appears to be taking her usual amount of Coumadin as prescribed, but has overall had poor appetite and nutrition recently with her ongoing pain.  Her hemoptysis has mostly resolved throughout the course of this hospitalization and diminished greatly.  She was given IV vitamin K in the ED and now her INR is approximately 2.  She has stable hemoglobin levels noted as well and is stable for discharge.  She is to remain off Coumadin as discussed with her daughter and follow-up with PCP in the very near future as recommended for adjustments to her medications as appropriate.  No other acute events noted throughout the course of this admission.  She is otherwise stable for discharge.  Discharge Diagnoses:  Active Problems:   Cough with hemoptysis  Principal discharge diagnosis: Hemoptysis in the setting of supratherapeutic INR.  Discharge Instructions  Discharge Instructions    Diet - low sodium heart healthy   Complete by: As directed    Increase activity slowly   Complete by: As directed      Allergies as of 12/20/2019   No Known Allergies     Medication List    STOP taking these medications   cephALEXin 500 MG capsule Commonly known as: KEFLEX   digoxin 0.125 MG tablet Commonly known as: LANOXIN   donepezil 5 MG tablet Commonly known as: ARICEPT   insulin lispro protamine-lispro (  75-25) 100 UNIT/ML Susp injection Commonly known as: HUMALOG 75/25 MIX   Levemir FlexTouch 100 UNIT/ML FlexPen Generic drug: insulin detemir   meloxicam 7.5 MG tablet Commonly known as: MOBIC   NovoLOG Mix 70/30 FlexPen (70-30) 100 UNIT/ML FlexPen Generic drug: insulin aspart protamine - aspart   predniSONE 20 MG tablet Commonly known as: DELTASONE   warfarin 5 MG tablet Commonly known as: COUMADIN      TAKE these medications   aspirin 81 MG EC tablet Take 81 mg by mouth daily.   atorvastatin 40 MG tablet Commonly known as: LIPITOR Take 40 mg by mouth daily.   Biofreeze Roll-On Colorless 4 % Gel Generic drug: Menthol (Topical Analgesic) Apply 1 application topically daily as needed.   carvedilol 25 MG tablet Commonly known as: COREG Take 25 mg by mouth.   diltiazem 60 MG 12 hr capsule Commonly known as: CARDIZEM SR Take by mouth at bedtime.   diphenhydrAMINE 25 MG tablet Commonly known as: BENADRYL Take 25 mg by mouth at bedtime as needed.   Entresto 49-51 MG Generic drug: sacubitril-valsartan Take by mouth at bedtime.   fenofibrate micronized 67 MG capsule Commonly known as: LOFIBRA Take 67 mg by mouth daily before breakfast.   fexofenadine 180 MG tablet Commonly known as: ALLEGRA Take 180 mg by mouth daily.   HYDROcodone-acetaminophen 5-325 MG tablet Commonly known as: NORCO/VICODIN Take 1 tablet by mouth every 6 (six) hours as needed for severe pain. What changed:   how much to take  how to take this  when to take this  reasons to take this  additional instructions   insulin glargine 100 UNIT/ML injection Commonly known as: LANTUS Inject 20 Units into the skin at bedtime.   lisinopril 10 MG tablet Commonly known as: ZESTRIL lisinopril 10 mg tablet  Take 1 tablet every day by oral route.   metFORMIN 1000 MG tablet Commonly known as: GLUCOPHAGE Take 1,000 mg by mouth daily with breakfast.   metoprolol tartrate 25 MG tablet Commonly known as: LOPRESSOR metoprolol tartrate 25 mg tablet   mometasone 50 MCG/ACT nasal spray Commonly known as: NASONEX 2 sprays daily.   pseudoephedrine 120 MG 12 hr tablet Commonly known as: SUDAFED Take 120 mg by mouth 2 (two) times daily.   QUEtiapine 25 MG tablet Commonly known as: SEROQUEL Take 25 mg by mouth at bedtime.       Follow-up Information    [facility] up  in 1 week(s).   Contact information: [address] [place] [address] [phone]              No Known Allergies  Consultations:  None   Procedures/Studies: CT Lumbar Spine Wo Contrast  Result Date: 12/11/2019 CLINICAL DATA:  Back pain.  Trauma 3 weeks ago. EXAM: CT LUMBAR SPINE WITHOUT CONTRAST TECHNIQUE: Multidetector CT imaging of the lumbar spine was performed without intravenous contrast administration. Multiplanar CT image reconstructions were also generated. COMPARISON:  None. FINDINGS: Segmentation: There are five lumbar type vertebral bodies. The last full intervertebral disc space is labeled L5-S1. Alignment: Grade 1 spondylolisthesis at L5 due to bilateral pars defects. There is also degenerative anterolisthesis of L4. Mild posterior listhesis of L1. Vertebrae: Age related osteoporosis but no acute fracture or worrisome bone lesions. Paraspinal and other soft tissues: Advanced aortic calcifications but no aneurysm. No retroperitoneal mass or adenopathy. Disc levels: T12-L1: No significant findings. L1-2: Bulging annulus and moderate facet disease but no significant spinal or foraminal stenosis. L2-3:  Bulging annulus and advanced facet disease contributing to mild spinal and bilateral lateral recess stenosis. No significant foraminal stenosis. L3-4: Right-sided laminectomy defect is noted. Diffuse bulging annulus and advanced facet disease but no significant spinal or foraminal stenosis. L4-5: Advanced disc disease and facet disease without significant spinal or foraminal stenosis. L5-S1: Bulging uncovered disc and severe facet disease. Mild spinal, lateral recess and mild to moderate foraminal stenosis bilaterally. IMPRESSION: 1. Grade 1 spondylolisthesis at L5 due to bilateral pars defects. There is also degenerative anterolisthesis of L4. 2. Mild spinal, lateral recess and mild to moderate foraminal stenosis bilaterally at L5-S1. 3. Mild spinal and bilateral lateral recess  stenosis at L2-3. 4. Right-sided laminectomy defect at L3-4. 5. Aortic atherosclerosis. Aortic Atherosclerosis (ICD10-I70.0). Electronically Signed   By: Marijo Sanes M.D.   On: 12/11/2019 15:54   CT Hip Right Wo Contrast  Result Date: 12/11/2019 CLINICAL DATA:  Right hip pain for 3 weeks.  History of trauma. EXAM: CT OF THE RIGHT HIP WITHOUT CONTRAST TECHNIQUE: Multidetector CT imaging of the right hip was performed according to the standard protocol. Multiplanar CT image reconstructions were also generated. COMPARISON:  None. FINDINGS: The right hip is normally located. No hip fracture or evidence of AVN. Mild to moderate hip joint degenerative changes. There are nondisplaced fractures involving the superior and inferior pubic rami. Moderate degenerative changes at the pubic symphysis. Enthesophytes noted at the ischial tuberosity and greater trochanter. The hip and pelvic musculature is grossly normal. No obvious muscle tear or intramuscular hematoma. Age related atherosclerotic calcifications are noted. No significant intrapelvic abnormalities are identified. IMPRESSION: 1. Nondisplaced fractures involving the superior and inferior pubic rami. 2. No hip fracture or evidence of AVN. 3. Mild to moderate hip joint degenerative changes. Electronically Signed   By: Marijo Sanes M.D.   On: 12/11/2019 15:48   DG Chest Portable 1 View  Result Date: 12/19/2019 CLINICAL DATA:  Began spitting up blood today, diabetes mellitus EXAM: PORTABLE CHEST 1 VIEW COMPARISON:  Portable exam 1305 hours without priors for comparison FINDINGS: LEFT subclavian AICD with leads projecting over RIGHT atrium, RIGHT ventricle, and coronary sinus. Upper normal heart size. Mediastinal contours and pulmonary vascularity normal. Atherosclerotic calcification aorta. Marked elevation of RIGHT diaphragm with RIGHT basilar atelectasis. Peribronchial thickening with minimal LEFT basilar atelectasis. No acute infiltrate, pleural effusion, or  pneumothorax. Bones demineralized. IMPRESSION: Bronchitic changes with bibasilar atelectasis greater on RIGHT. Electronically Signed   By: Lavonia Dana M.D.   On: 12/19/2019 13:57   DG Hip Unilat W or Wo Pelvis 2-3 Views Left  Result Date: 12/19/2019 CLINICAL DATA:  BILATERAL hip pain, known RIGHT pubic ramus fractures 10 days ago, question new injury EXAM: DG HIP (WITH OR WITHOUT PELVIS) 2-3V LEFT COMPARISON:  None FINDINGS: Osseous demineralization. LEFT hip and SI joint spaces preserved. Fractures of RIGHT superior inferior pubic rami identified at margin of exam. No acute fracture, dislocation, or bone destruction. IMPRESSION: No acute osseous abnormalities. Electronically Signed   By: Lavonia Dana M.D.   On: 12/19/2019 13:59   DG Hip Unilat W or Wo Pelvis 2-3 Views Right  Result Date: 12/19/2019 CLINICAL DATA:  Known RIGHT pubic ramus fracture 10 days ago, BILATERAL hip pain, screening for new injury, began spitting up blood today, diabetes mellitus EXAM: DG HIP (WITH OR WITHOUT PELVIS) 2-3V RIGHT COMPARISON:  CT RIGHT hip 12/11/2019 FINDINGS: Osseous demineralization. Hip and SI joint spaces preserved. Again identified fractures of the RIGHT superior and inferior pubic rami. No new fracture, dislocation,  or bone destruction. Enthesopathy at RIGHT greater trochanter. Atherosclerotic calcifications in pelvis. IMPRESSION: Again identified fractures of the RIGHT inferior and superior pubic rami. No new osseous abnormalities. Electronically Signed   By: Lavonia Dana M.D.   On: 12/19/2019 13:56    Discharge Exam: Vitals:   12/20/19 0535 12/20/19 1403  BP: 140/75 132/70  Pulse: 85 80  Resp:  18  Temp: (!) 97.4 F (36.3 C) 97.9 F (36.6 C)  SpO2: 91% 91%   Vitals:   12/20/19 0100 12/20/19 0140 12/20/19 0535 12/20/19 1403  BP: 118/66 128/71 140/75 132/70  Pulse: 74 81 85 80  Resp: 20 20  18   Temp:  98.4 F (36.9 C) (!) 97.4 F (36.3 C) 97.9 F (36.6 C)  TempSrc:  Oral Oral   SpO2: 93% 96%  91% 91%  Weight:  73.3 kg    Height:  5\' 2"  (1.575 m)      General: Pt is alert, awake, not in acute distress Cardiovascular: RRR, S1/S2 +, no rubs, no gallops Respiratory: CTA bilaterally, no wheezing, no rhonchi Abdominal: Soft, NT, ND, bowel sounds + Extremities: no edema, no cyanosis    The results of significant diagnostics from this hospitalization (including imaging, microbiology, ancillary and laboratory) are listed below for reference.     Microbiology: Recent Results (from the past 240 hour(s))  Respiratory Panel by RT PCR (Flu A&B, Covid) - Nasopharyngeal Swab     Status: None   Collection Time: 12/19/19 12:29 PM   Specimen: Nasopharyngeal Swab  Result Value Ref Range Status   SARS Coronavirus 2 by RT PCR NEGATIVE NEGATIVE Final    Comment: (NOTE) SARS-CoV-2 target nucleic acids are NOT DETECTED.  The SARS-CoV-2 RNA is generally detectable in upper respiratoy specimens during the acute phase of infection. The lowest concentration of SARS-CoV-2 viral copies this assay can detect is 131 copies/mL. A negative result does not preclude SARS-Cov-2 infection and should not be used as the sole basis for treatment or other patient management decisions. A negative result may occur with  improper specimen collection/handling, submission of specimen other than nasopharyngeal swab, presence of viral mutation(s) within the areas targeted by this assay, and inadequate number of viral copies (<131 copies/mL). A negative result must be combined with clinical observations, patient history, and epidemiological information. The expected result is Negative.  Fact Sheet for Patients:  PinkCheek.be  Fact Sheet for Healthcare Providers:  GravelBags.it  This test is no t yet approved or cleared by the [place] FDA and  has been authorized for detection and/or diagnosis of SARS-CoV-2 by FDA under an Emergency Use  Authorization (EUA). This EUA will remain  in effect (meaning this test can be used) for the duration of the COVID-19 declaration under Section 564(b)(1) of the Act, 21 U.S.C. section 360bbb-3(b)(1), unless the authorization is terminated or revoked sooner.     Influenza A by PCR NEGATIVE NEGATIVE Final   Influenza B by PCR NEGATIVE NEGATIVE Final    Comment: (NOTE) The Xpert Xpress SARS-CoV-2/FLU/RSV assay is intended as an aid in  the diagnosis of influenza from Nasopharyngeal swab specimens and  should not be used as a sole basis for treatment. Nasal washings and  aspirates are unacceptable for Xpert Xpress SARS-CoV-2/FLU/RSV  testing.  Fact Sheet for Patients: PinkCheek.be  Fact Sheet for Healthcare Providers: GravelBags.it  This test is not yet approved or cleared by the [place] FDA and  has been authorized for detection and/or diagnosis of SARS-CoV-2 by  FDA under an  Emergency Use Authorization (EUA). This EUA will remain  in effect (meaning this test can be used) for the duration of the  Covid-19 declaration under Section 564(b)(1) of the Act, 21  U.S.C. section 360bbb-3(b)(1), unless the authorization is  terminated or revoked. Performed at [facility], [address], [place], [place] [address]      Labs: BNP (last 3 results) No results for input(s): BNP in the last 8760 hours. Basic Metabolic Panel: Recent Labs  Lab 12/19/19 1235 12/20/19 1330  NA 135 137  K 3.9 3.9  CL 104 105  CO2 22 22  GLUCOSE 185* 196*  BUN 36* 27*  CREATININE 1.04* 0.75  CALCIUM 9.0 9.1   Liver Function Tests: Recent Labs  Lab 12/19/19 1235  AST 23  ALT 23  ALKPHOS 361*  BILITOT 1.7*  PROT 6.6  ALBUMIN 3.2*   No results for input(s): LIPASE, AMYLASE in the last 168 hours. No results for input(s): AMMONIA in the last 168 hours. CBC: Recent Labs  Lab 12/19/19 1235 12/20/19 1330  WBC 12.4* 13.9*  NEUTROABS  10.1*  --   HGB 11.7* 11.1*  HCT 35.6* 34.8*  MCV 91.0 93.3  PLT 236 272   Cardiac Enzymes: No results for input(s): CKTOTAL, CKMB, CKMBINDEX, TROPONINI in the last 168 hours. BNP: Invalid input(s): POCBNP CBG: No results for input(s): GLUCAP in the last 168 hours. D-Dimer No results for input(s): DDIMER in the last 72 hours. Hgb A1c No results for input(s): HGBA1C in the last 72 hours. Lipid Profile No results for input(s): CHOL, HDL, LDLCALC, TRIG, CHOLHDL, LDLDIRECT in the last 72 hours. Thyroid function studies No results for input(s): TSH, T4TOTAL, T3FREE, THYROIDAB in the last 72 hours.  Invalid input(s): FREET3 Anemia work up No results for input(s): VITAMINB12, FOLATE, FERRITIN, TIBC, IRON, RETICCTPCT in the last 72 hours. Urinalysis No results found for: COLORURINE, APPEARANCEUR, [place], [place], [place], [place], [place], [place], PROTEINUR, UROBILINOGEN, NITRITE, LEUKOCYTESUR Sepsis Labs Invalid input(s): PROCALCITONIN,  WBC,  LACTICIDVEN Microbiology Recent Results (from the past 240 hour(s))  Respiratory Panel by RT PCR (Flu A&B, Covid) - Nasopharyngeal Swab     Status: None   Collection Time: 12/19/19 12:29 PM   Specimen: Nasopharyngeal Swab  Result Value Ref Range Status   SARS Coronavirus 2 by RT PCR NEGATIVE NEGATIVE Final    Comment: (NOTE) SARS-CoV-2 target nucleic acids are NOT DETECTED.  The SARS-CoV-2 RNA is generally detectable in upper respiratoy specimens during the acute phase of infection. The lowest concentration of SARS-CoV-2 viral copies this assay can detect is 131 copies/mL. A negative result does not preclude SARS-Cov-2 infection and should not be used as the sole basis for treatment or other patient management decisions. A negative result may occur with  improper specimen collection/handling, submission of specimen other than nasopharyngeal swab, presence of viral mutation(s) within the areas targeted by this assay, and inadequate  number of viral copies (<131 copies/mL). A negative result must be combined with clinical observations, patient history, and epidemiological information. The expected result is Negative.  Fact Sheet for Patients:  PinkCheek.be  Fact Sheet for Healthcare Providers:  GravelBags.it  This test is no t yet approved or cleared by the [place] FDA and  has been authorized for detection and/or diagnosis of SARS-CoV-2 by FDA under an Emergency Use Authorization (EUA). This EUA will remain  in effect (meaning this test can be used) for the duration of the COVID-19 declaration under Section 564(b)(1) of the Act, 21 U.S.C. section 360bbb-3(b)(1), unless  the authorization is terminated or revoked sooner.     Influenza A by PCR NEGATIVE NEGATIVE Final   Influenza B by PCR NEGATIVE NEGATIVE Final    Comment: (NOTE) The Xpert Xpress SARS-CoV-2/FLU/RSV assay is intended as an aid in  the diagnosis of influenza from Nasopharyngeal swab specimens and  should not be used as a sole basis for treatment. Nasal washings and  aspirates are unacceptable for Xpert Xpress SARS-CoV-2/FLU/RSV  testing.  Fact Sheet for Patients: PinkCheek.be  Fact Sheet for Healthcare Providers: GravelBags.it  This test is not yet approved or cleared by the [place] FDA and  has been authorized for detection and/or diagnosis of SARS-CoV-2 by  FDA under an Emergency Use Authorization (EUA). This EUA will remain  in effect (meaning this test can be used) for the duration of the  Covid-19 declaration under Section 564(b)(1) of the Act, 21  U.S.C. section 360bbb-3(b)(1), unless the authorization is  terminated or revoked. Performed at [facility], [address] [place], [place] [address]      Time coordinating discharge: 35 minutes  SIGNED:   Rodena Goldmann, DO [facility] 12/20/2019,  3:15 PM  If 7PM-7AM, please contact night-coverage [email or web address]

## 2019-12-20 NOTE — Care Management Obs Status (Signed)
[facility] NOTIFICATION   Patient Details  Name: Kathryn Sloan MRN: [id number] Date of Birth: October 13, 1935   Medicare Observation Status Notification Given:  Yes    Tommy Medal 12/20/2019, 3:34 PM

## 2019-12-20 NOTE — Progress Notes (Signed)
Nsg Discharge Note  Admit Date:  12/19/2019 Discharge date: 12/20/2019   Kathryn Sloan to be D/C'd Home per MD order.  AVS completed. Patient's caregiver able to verbalize understanding.  Discharge Medication: Allergies as of 12/20/2019   No Known Allergies     Medication List    STOP taking these medications   cephALEXin 500 MG capsule Commonly known as: KEFLEX   digoxin 0.125 MG tablet Commonly known as: LANOXIN   donepezil 5 MG tablet Commonly known as: ARICEPT   insulin lispro protamine-lispro (75-25) 100 UNIT/ML Susp injection Commonly known as: HUMALOG 75/25 MIX   Levemir FlexTouch 100 UNIT/ML FlexPen Generic drug: insulin detemir   meloxicam 7.5 MG tablet Commonly known as: MOBIC   NovoLOG Mix 70/30 FlexPen (70-30) 100 UNIT/ML FlexPen Generic drug: insulin aspart protamine - aspart   predniSONE 20 MG tablet Commonly known as: DELTASONE   warfarin 5 MG tablet Commonly known as: COUMADIN     TAKE these medications   aspirin 81 MG EC tablet Take 81 mg by mouth daily.   atorvastatin 40 MG tablet Commonly known as: LIPITOR Take 40 mg by mouth daily.   Biofreeze Roll-On Colorless 4 % Gel Generic drug: Menthol (Topical Analgesic) Apply 1 application topically daily as needed.   carvedilol 25 MG tablet Commonly known as: COREG Take 25 mg by mouth.   diltiazem 60 MG 12 hr capsule Commonly known as: CARDIZEM SR Take by mouth at bedtime.   diphenhydrAMINE 25 MG tablet Commonly known as: BENADRYL Take 25 mg by mouth at bedtime as needed.   Entresto 49-51 MG Generic drug: sacubitril-valsartan Take by mouth at bedtime.   fenofibrate micronized 67 MG capsule Commonly known as: LOFIBRA Take 67 mg by mouth daily before breakfast.   fexofenadine 180 MG tablet Commonly known as: ALLEGRA Take 180 mg by mouth daily.   HYDROcodone-acetaminophen 5-325 MG tablet Commonly known as: NORCO/VICODIN Take 1 tablet by mouth every 6 (six) hours as needed for  severe pain. What changed:   how much to take  how to take this  when to take this  reasons to take this  additional instructions   insulin glargine 100 UNIT/ML injection Commonly known as: LANTUS Inject 20 Units into the skin at bedtime.   lisinopril 10 MG tablet Commonly known as: ZESTRIL lisinopril 10 mg tablet  Take 1 tablet every day by oral route.   metFORMIN 1000 MG tablet Commonly known as: GLUCOPHAGE Take 1,000 mg by mouth daily with breakfast.   metoprolol tartrate 25 MG tablet Commonly known as: LOPRESSOR metoprolol tartrate 25 mg tablet   mometasone 50 MCG/ACT nasal spray Commonly known as: NASONEX 2 sprays daily.   pseudoephedrine 120 MG 12 hr tablet Commonly known as: SUDAFED Take 120 mg by mouth 2 (two) times daily.   QUEtiapine 25 MG tablet Commonly known as: SEROQUEL Take 25 mg by mouth at bedtime.       Discharge Assessment: Vitals:   12/20/19 0535 12/20/19 1403  BP: 140/75 132/70  Pulse: 85 80  Resp:  18  Temp: (!) 97.4 F (36.3 C) 97.9 F (36.6 C)  SpO2: 91% 91%   Skin clean, dry and intact without evidence of skin break down, no evidence of skin tears noted. IV catheter discontinued intact. Site without signs and symptoms of complications - no redness or edema noted at insertion site, patient denies c/o pain - only slight tenderness at site.  Dressing with slight pressure applied.  D/c Instructions-Education: Discharge instructions given to  patient's daughter Kathryn Sloan with verbalized understanding. D/c education completed with patient's daughter including follow up instructions, medication list, d/c activities limitations if indicated, with other d/c instructions as indicated by MD - patient able to verbalize understanding, all questions fully answered. Patient instructed to return to ED, call 911, or call MD for any changes in condition.  Patient escorted via [place], and D/C home via private auto.  Berton Bon, RN 12/20/2019  4:11 PM

## 2019-12-21 ENCOUNTER — Other Ambulatory Visit: Payer: Medicare Other | Admitting: Nurse Practitioner

## 2019-12-21 ENCOUNTER — Encounter: Payer: Self-pay | Admitting: Nurse Practitioner

## 2019-12-21 ENCOUNTER — Other Ambulatory Visit: Payer: Self-pay

## 2019-12-21 DIAGNOSIS — Z515 Encounter for palliative care: Secondary | ICD-10-CM

## 2019-12-21 DIAGNOSIS — F0391 Unspecified dementia with behavioral disturbance: Secondary | ICD-10-CM

## 2019-12-21 NOTE — Progress Notes (Signed)
[facility] Consult Note Telephone: [phone]  Fax: [phone]  PATIENT NAME: Kathryn Sloan DOB: December 31, 1935 MRN: [id number]  PRIMARY CARE PROVIDER:   [facility]  REFERRING PROVIDER:  Vesta Mixer [address] [place],  [place] [address]   RESPONSIBLE PARTY:   Etoile Looman daughter [phone] or [phone]  RECOMMENDATIONS and PLAN: 1.ACP: Made DNR/placed in [place]  2. Pain secondary to pelvic fx; currently controlled with hydrocodone, will see primary in am for appointment.  3. Nausea; bland diet; nutrition discussed; zofran sent in for nausea prn  4.Palliative care encounter; Palliative medicine team will continue to support patient, patient's family, and medical team. Visit consisted of counseling and education dealing with the complex and emotionally intense issues of symptom management and palliative care in the setting of serious and potentially life-threatening illness\  5. F/u 1 week for ongoing discussion goc, symptom management nausea  I spent 90 minutes providing this consultation,  from 10:30am to 12:00pm. More than 50% of the time in this consultation was spent coordinating communication.   HISTORY OF PRESENT ILLNESS:  Kathryn Sloan is a 84 y.o. year old female with multiple medical problems including dementia, intraductal carcinoma. Visit to [facility] ED 12/11/2019 with workup significant for CT of the hip shows nondisplaced inferior and superior pubic rami fx's with recommendation to be followed closely by Orthopedic, d/c home. Hospitalized Kathryn Sloan 12/19/2019 to 12/20/2019 for hemoptysis, INR 10 receiving Vit K; hold Coumadin for afib, poor appetite; ongoing pain prescribed Hydrocodone by hospitalist upon d/c home. Follow up palliative care visit for Ms. Kathryn Sloan for ongoing discussion of medical goals of care, chronic disease progression of dementia. Kathryn Sloan returned home  last night and her daughter Kathryn Sloan is her primary caregiver. At present Kathryn Sloan is sitting in the wheelchair in her living room, and her son Kathryn Sloan is present as well as daughter Kathryn Sloan. We talked about purpose of Palliative care visit. We talked about recent hospitalizations. We talked about how Kathryn Sloan has been doing since she returned home. Kathryn Sloan continues to have a very poor appetite. Kathryn Sloan has been experiencing more nausea. We talked about poor appetite, We talked about different nutritional supplements that can be added in order to help with nausea in addition to the Zofran. We talked about antiemetic zofran, will send prescription. We talked about symptoms of pain. At present Kathryn Sloan does appear comfortable. Kathryn Sloan was discharged on hydrocodone by Hospitalist is to follow up with primary care provider at [facility]. We called today to try to get an in-person appointment today for follow-up hospitalization as multiple medications were changed, urgent Home Health referral for PT / OT, urgent Orthopedic appointment as she had not followed up since hospitalization with pelvic fracture, re-evaluation of Coumadin. Kathryn Sloan call [facility] in attempts to schedule an appointment. [facility] was able to schedule a telemedicine visit with Dr. Yong Channel at 8:30 tomorrow morning. Discuss with Kathryn Sloan and Kathryn Sloan at length list of questions to go over. Also discuss with Kathryn Sloan to bring the list and discharge summary to [facility] to have them make a copy to give to Dr. Yong Channel prior to appointment for review of all questions and concerns. We talked about also taking the discharge summary with correct current medications so the bubble packs can have the d/c medications removed and new added.  Kathryn Sloan talked at length  about her concerns with functional ability for Ms. Kathryn Sloan. Kathryn Sloan endorses she is concerned that she could hurt her trying to lift  her. They do have hoyer lift  although they are concerned about using it they are concerned, dropping her. Also Kathryn Sloan is afraid of the lift. We talked about recent hospitalizations including INR greater than 10 requiring vitamin K. Currently Coumadin on hold. We talked about the possible option of switching to a different medication for anticoagulation that does not require frequent lab monitoring as this has been very difficult for Kathryn Sloan to do. Kathryn Sloan endorses is hard to keep up with the dosages and it is even harder to get Ms. Kathryn Sloan to appointments with Dr Marzetta Merino, Cardiologist to regulate this medication. Discuss with Kathryn Sloan and Kathryn Sloan to follow up with Dr Yong Channel about their concerns concerning the Coumadin. We talked about Kathryn Sloan worsening cognition. We talked about Kathryn Sloan cough, chest x-ray from hospitalization results.  Kathryn Sloan does appear comfortable, smiling and making eye contact. Kathryn Sloan was cooperative with assessment. We talked about challenges that Kathryn Sloan is facing with caregiver fatigue and stress. We talked about increasing skill level with Ms. Kathryn Sloan since hospitalizations. We talked about medical goals of care. We talked about role of Palliative care in plan of care. Discuss scheduled telemedicine follow-up Palliative care visit for 3 days for follow-up after Dr Yong Channel appointment. Discuss with Kathryn Sloan to contact sooner should Kathryn Sloan begins to decline or questions. Kathryn Sloan in agreement. Will fax Dr. Yong Channel at [facility] last to discharge summaries to review prior to the appointment in the morning. Kathryn Sloan express being thankful for Palliative care visit today. Appointments scheduled. Therapeutical listening, emotional support provided. Contact information provided. Questions answered to satisfaction. Hand wrote points of discussion at visit with Dr. Yong Channel had appointment for Kathryn Sloan to remember  RX Zofran 4mg  Q8 PRN nausea or vomiting, no refills, number 24 called  into [facility] was asked to help to continue to address goals of care.   CODE STATUS: DNR  PPS: 40% HOSPICE ELIGIBILITY/DIAGNOSIS: TBD  PAST MEDICAL HISTORY:  Past Medical History:  Diagnosis Date  . Diabetes mellitus without complication ([place])     SOCIAL HX:  Social History   Tobacco Use  . Smoking status: Never Smoker  . Smokeless tobacco: Never Used  Substance Use Topics  . Alcohol use: Never    ALLERGIES: No Known Allergies     PHYSICAL EXAM:   General: NAD, frail appearing, elderly pleasantly confused female Cardiovascular: regular rate and rhythm Pulmonary: decrease bases, otherwise clear Neurological: generalized weakness; few steps with walker  Damari Suastegui Ihor Gully, NP

## 2019-12-22 ENCOUNTER — Inpatient Hospital Stay (HOSPITAL_COMMUNITY)
Admission: EM | Admit: 2019-12-22 | Discharge: 2019-12-24 | DRG: 193 | Disposition: A | Discharge status: Home Health | Payer: Medicare Other | Attending: Family Medicine | Admitting: Family Medicine

## 2019-12-22 ENCOUNTER — Emergency Department (HOSPITAL_COMMUNITY): Payer: Medicare Other

## 2019-12-22 ENCOUNTER — Encounter (HOSPITAL_COMMUNITY): Payer: Self-pay | Admitting: Internal Medicine

## 2019-12-22 DIAGNOSIS — Z7401 Bed confinement status: Secondary | ICD-10-CM

## 2019-12-22 DIAGNOSIS — W19XXXD Unspecified fall, subsequent encounter: Secondary | ICD-10-CM | POA: Diagnosis present

## 2019-12-22 DIAGNOSIS — Z853 Personal history of malignant neoplasm of breast: Secondary | ICD-10-CM

## 2019-12-22 DIAGNOSIS — Z7901 Long term (current) use of anticoagulants: Secondary | ICD-10-CM | POA: Diagnosis not present

## 2019-12-22 DIAGNOSIS — I5022 Chronic systolic (congestive) heart failure: Secondary | ICD-10-CM | POA: Diagnosis present

## 2019-12-22 DIAGNOSIS — R042 Hemoptysis: Secondary | ICD-10-CM | POA: Diagnosis present

## 2019-12-22 DIAGNOSIS — I152 Hypertension secondary to endocrine disorders: Secondary | ICD-10-CM | POA: Diagnosis present

## 2019-12-22 DIAGNOSIS — Z90710 Acquired absence of both cervix and uterus: Secondary | ICD-10-CM | POA: Diagnosis not present

## 2019-12-22 DIAGNOSIS — J188 Other pneumonia, unspecified organism: Secondary | ICD-10-CM

## 2019-12-22 DIAGNOSIS — E1169 Type 2 diabetes mellitus with other specified complication: Secondary | ICD-10-CM | POA: Diagnosis present

## 2019-12-22 DIAGNOSIS — Z801 Family history of malignant neoplasm of trachea, bronchus and lung: Secondary | ICD-10-CM

## 2019-12-22 DIAGNOSIS — Z7189 Other specified counseling: Secondary | ICD-10-CM | POA: Diagnosis not present

## 2019-12-22 DIAGNOSIS — Z8542 Personal history of malignant neoplasm of other parts of uterus: Secondary | ICD-10-CM | POA: Diagnosis not present

## 2019-12-22 DIAGNOSIS — I428 Other cardiomyopathies: Secondary | ICD-10-CM | POA: Diagnosis present

## 2019-12-22 DIAGNOSIS — Z20822 Contact with and (suspected) exposure to covid-19: Secondary | ICD-10-CM | POA: Diagnosis present

## 2019-12-22 DIAGNOSIS — S32511D Fracture of superior rim of right pubis, subsequent encounter for fracture with routine healing: Secondary | ICD-10-CM

## 2019-12-22 DIAGNOSIS — E1159 Type 2 diabetes mellitus with other circulatory complications: Secondary | ICD-10-CM | POA: Diagnosis present

## 2019-12-22 DIAGNOSIS — J189 Pneumonia, unspecified organism: Principal | ICD-10-CM

## 2019-12-22 DIAGNOSIS — Z794 Long term (current) use of insulin: Secondary | ICD-10-CM | POA: Diagnosis not present

## 2019-12-22 DIAGNOSIS — Z95810 Presence of automatic (implantable) cardiac defibrillator: Secondary | ICD-10-CM | POA: Diagnosis present

## 2019-12-22 DIAGNOSIS — I447 Left bundle-branch block, unspecified: Secondary | ICD-10-CM | POA: Diagnosis present

## 2019-12-22 DIAGNOSIS — E119 Type 2 diabetes mellitus without complications: Secondary | ICD-10-CM

## 2019-12-22 DIAGNOSIS — S32599A Other specified fracture of unspecified pubis, initial encounter for closed fracture: Secondary | ICD-10-CM | POA: Diagnosis present

## 2019-12-22 DIAGNOSIS — J9601 Acute respiratory failure with hypoxia: Secondary | ICD-10-CM | POA: Diagnosis present

## 2019-12-22 DIAGNOSIS — Z515 Encounter for palliative care: Secondary | ICD-10-CM

## 2019-12-22 DIAGNOSIS — E785 Hyperlipidemia, unspecified: Secondary | ICD-10-CM | POA: Diagnosis present

## 2019-12-22 DIAGNOSIS — Z79899 Other long term (current) drug therapy: Secondary | ICD-10-CM | POA: Diagnosis not present

## 2019-12-22 DIAGNOSIS — F0390 Unspecified dementia without behavioral disturbance: Secondary | ICD-10-CM

## 2019-12-22 DIAGNOSIS — Z90722 Acquired absence of ovaries, bilateral: Secondary | ICD-10-CM

## 2019-12-22 DIAGNOSIS — Z7984 Long term (current) use of oral hypoglycemic drugs: Secondary | ICD-10-CM

## 2019-12-22 DIAGNOSIS — R000 Tachycardia, unspecified: Secondary | ICD-10-CM | POA: Diagnosis present

## 2019-12-22 DIAGNOSIS — R791 Abnormal coagulation profile: Secondary | ICD-10-CM | POA: Diagnosis present

## 2019-12-22 DIAGNOSIS — Z8249 Family history of ischemic heart disease and other diseases of the circulatory system: Secondary | ICD-10-CM

## 2019-12-22 HISTORY — DX: Presence of automatic (implantable) cardiac defibrillator: Z95.810

## 2019-12-22 HISTORY — DX: Other cardiomyopathies: I42.8

## 2019-12-22 HISTORY — DX: Chronic systolic (congestive) heart failure: I50.22

## 2019-12-22 HISTORY — DX: Type 2 diabetes mellitus with other specified complication: E11.69

## 2019-12-22 HISTORY — DX: Hypertension secondary to endocrine disorders: I15.2

## 2019-12-22 HISTORY — DX: Left bundle-branch block, unspecified: I44.7

## 2019-12-22 HISTORY — DX: Type 2 diabetes mellitus with other specified complication: E78.5

## 2019-12-22 HISTORY — DX: Type 2 diabetes mellitus with other circulatory complications: E11.59

## 2019-12-22 LAB — CBC WITH DIFFERENTIAL/PLATELET
Abs Immature Granulocytes: 0.11 10*3/uL — ABNORMAL HIGH (ref 0.00–0.07)
Basophils Absolute: 0.1 10*3/uL (ref 0.0–0.1)
Basophils Relative: 1 %
Eosinophils Absolute: 0.2 10*3/uL (ref 0.0–0.5)
Eosinophils Relative: 2 %
HCT: 35.5 % — ABNORMAL LOW (ref 36.0–46.0)
Hemoglobin: 11.2 g/dL — ABNORMAL LOW (ref 12.0–15.0)
Immature Granulocytes: 1 %
Lymphocytes Relative: 16 %
Lymphs Abs: 1.8 10*3/uL (ref 0.7–4.0)
MCH: 29.5 pg (ref 26.0–34.0)
MCHC: 31.5 g/dL (ref 30.0–36.0)
MCV: 93.4 fL (ref 80.0–100.0)
Monocytes Absolute: 0.9 10*3/uL (ref 0.1–1.0)
Monocytes Relative: 8 %
Neutro Abs: 8.1 10*3/uL — ABNORMAL HIGH (ref 1.7–7.7)
Neutrophils Relative %: 72 %
Platelets: 295 10*3/uL (ref 150–400)
RBC: 3.8 MIL/uL — ABNORMAL LOW (ref 3.87–5.11)
RDW: 14.6 % (ref 11.5–15.5)
WBC: 11.1 10*3/uL — ABNORMAL HIGH (ref 4.0–10.5)
nRBC: 0 % (ref 0.0–0.2)

## 2019-12-22 LAB — COMPREHENSIVE METABOLIC PANEL
ALT: 29 U/L (ref 0–44)
AST: 35 U/L (ref 15–41)
Albumin: 2.5 g/dL — ABNORMAL LOW (ref 3.5–5.0)
Alkaline Phosphatase: 465 U/L — ABNORMAL HIGH (ref 38–126)
Anion gap: 8 (ref 5–15)
BUN: 29 mg/dL — ABNORMAL HIGH (ref 8–23)
CO2: 25 mmol/L (ref 22–32)
Calcium: 9.4 mg/dL (ref 8.9–10.3)
Chloride: 104 mmol/L (ref 98–111)
Creatinine, Ser: 0.96 mg/dL (ref 0.44–1.00)
GFR, Estimated: 59 mL/min — ABNORMAL LOW (ref >60)
Glucose, Bld: 170 mg/dL — ABNORMAL HIGH (ref 70–99)
Potassium: 4.1 mmol/L (ref 3.5–5.1)
Sodium: 137 mmol/L (ref 135–145)
Total Bilirubin: 2 mg/dL — ABNORMAL HIGH (ref 0.3–1.2)
Total Protein: 6.4 g/dL — ABNORMAL LOW (ref 6.5–8.1)

## 2019-12-22 LAB — PROTIME-INR
INR: 5.1 (ref 0.8–1.2)
Prothrombin Time: 46 seconds — ABNORMAL HIGH (ref 11.4–15.2)

## 2019-12-22 LAB — RESPIRATORY PANEL BY RT PCR (FLU A&B, COVID)
Influenza A by PCR: NEGATIVE
Influenza B by PCR: NEGATIVE
SARS Coronavirus 2 by RT PCR: NEGATIVE

## 2019-12-22 LAB — D-DIMER, QUANTITATIVE: D-Dimer, Quant: 1.15 ug/mL-FEU — ABNORMAL HIGH (ref 0.00–0.50)

## 2019-12-22 MED ORDER — HYDROCODONE-ACETAMINOPHEN 5-325 MG PO TABS
1.0000 | ORAL_TABLET | ORAL | Status: DC | PRN
  Administered 2019-12-23: 1 via ORAL
  Administered 2019-12-23: 2 via ORAL
  Administered 2019-12-23: 1 via ORAL
  Administered 2019-12-23 – 2019-12-24 (×2): 2 via ORAL
  Filled 2019-12-22: qty 1
  Filled 2019-12-22: qty 2
  Filled 2019-12-22: qty 1
  Filled 2019-12-22 (×2): qty 2

## 2019-12-22 MED ORDER — SODIUM CHLORIDE 0.9 % IV SOLN
500.0000 mg | Freq: Once | INTRAVENOUS | Status: AC
  Administered 2019-12-22: 500 mg via INTRAVENOUS
  Filled 2019-12-22: qty 500

## 2019-12-22 MED ORDER — SENNOSIDES-DOCUSATE SODIUM 8.6-50 MG PO TABS
1.0000 | ORAL_TABLET | Freq: Every evening | ORAL | Status: DC | PRN
  Administered 2019-12-23: 1 via ORAL
  Filled 2019-12-22: qty 1

## 2019-12-22 MED ORDER — IOHEXOL 350 MG/ML SOLN
75.0000 mL | Freq: Once | INTRAVENOUS | Status: AC | PRN
  Administered 2019-12-22: 75 mL via INTRAVENOUS

## 2019-12-22 MED ORDER — ACETAMINOPHEN 325 MG PO TABS: 650.0000 mg | ORAL_TABLET | Freq: Four times a day (QID) | ORAL | Status: DC | PRN

## 2019-12-22 MED ORDER — ONDANSETRON HCL 4 MG PO TABS
4.0000 mg | ORAL_TABLET | Freq: Four times a day (QID) | ORAL | Status: DC | PRN
  Administered 2019-12-23 – 2019-12-24 (×3): 4 mg via ORAL
  Filled 2019-12-22 (×3): qty 1

## 2019-12-22 MED ORDER — SODIUM CHLORIDE 0.9 % IV SOLN
1.5000 g | Freq: Four times a day (QID) | INTRAVENOUS | Status: DC
  Administered 2019-12-23 – 2019-12-24 (×7): 1.5 g via INTRAVENOUS
  Filled 2019-12-22 (×2): qty 4
  Filled 2019-12-22 (×2): qty 1.5
  Filled 2019-12-22 (×7): qty 4

## 2019-12-22 MED ORDER — SODIUM CHLORIDE 0.9 % IV SOLN
1.0000 g | Freq: Once | INTRAVENOUS | Status: AC
  Administered 2019-12-22: 1 g via INTRAVENOUS
  Filled 2019-12-22: qty 10

## 2019-12-22 MED ORDER — ONDANSETRON HCL 4 MG/2ML IJ SOLN: 4.0000 mg | Freq: Four times a day (QID) | INTRAMUSCULAR | Status: DC | PRN

## 2019-12-22 MED ORDER — HYDROCODONE-ACETAMINOPHEN 5-325 MG PO TABS
1.0000 | ORAL_TABLET | Freq: Once | ORAL | Status: AC
  Administered 2019-12-22: 1 via ORAL
  Filled 2019-12-22: qty 1

## 2019-12-22 MED ORDER — PHYTONADIONE 5 MG PO TABS
2.5000 mg | ORAL_TABLET | Freq: Once | ORAL | Status: AC
  Administered 2019-12-23: 2.5 mg via ORAL
  Filled 2019-12-22: qty 1

## 2019-12-22 MED ORDER — ACETAMINOPHEN 650 MG RE SUPP: 650.0000 mg | Freq: Four times a day (QID) | RECTAL | Status: DC | PRN

## 2019-12-22 NOTE — ED Notes (Signed)
Pt returned from radiology via stretcher

## 2019-12-22 NOTE — ED Notes (Signed)
Pt to CT scan.

## 2019-12-22 NOTE — ED Notes (Signed)
Date and time results received: 12/22/19 1935 (use smartphrase ".now" to insert current time)  Test: INR Critical Value: 5.1  Name of Provider Notified: Gilford Raid, MD  Orders Received? Or Actions Taken?: will monitor

## 2019-12-22 NOTE — ED Provider Notes (Signed)
[facility] EMERGENCY DEPARTMENT Provider Note   CSN: [id number] Arrival date & time: 12/22/19  1605     History Chief Complaint  Patient presents with  . Leg Pain    Kathryn Sloan is a 84 y.o. female.  Pt presents to the ED today with cough and right pubic rami fracture.  Pt fell and sustained a nondisplaced inferior and superior pubic rami fracture from a fall on 10/27.  Pt has been having pain in the hip since the injury.  She has been taking hydrocodone which has been helping the pain.  Pt was admitted from 11/7-8 for hemoptysis and an INR of 10.  INR came down to 2 at d/c.  Pt has not taken any coumadin since d/c as she was told to be off the coumadin until the hemoptysis resolves.  Pt has had a horrible cough for the past few days.  She has no thad a fever.  Pt's daughter said she is confused on which meds she is supposed to give patient as several meds were changed.  The pt had a hospice visit yesterday.  The nurse called to make a televisit appt with her pcp for today at 8:30.  Pt did do the televisit.  They told the daughter that pt needed to be seen in person which is why she is here. I can't tell from her chart why she is on coumadin.  Her last cardiology appointment in 2019 mentioned they were unclear why she is on coumadin, but they kept her on it because her chadvasc was elevated.  Daughter has no idea why she is on it either.        Past Medical History:  Diagnosis Date  . Diabetes mellitus without complication ([place])   . Anticoagulated by anticoagulation treatment  Rx coumadin  . Biventricular automatic implantable cardioverter defibrillator in situ 2010  DCM LBBB CRTD in situ ([employer]TR[employer] 2010)  . Breast cancer , unspecified (CMS-HCC) 2016  h/o TAH; BSO for endometrial cancer  . Chronic systolic heart failure (CMS-HCC) 2010  EF=30%, NICM and LBBB s/p BiV ICD 2010 at [facility], most recent EF with CRTD =40-50%  . Chronic venous insufficiency  Rx  compression stockings  . Diaphragmatic disorder  h/o elevated right hemidiaphragm  . DM2 (diabetes mellitus, type 2) (CMS-HCC)  Rx insulin  . Endometrial carcinoma (CMS-HCC) 1998  s/p TAH; BSO  . Fatty liver  . Fitting and adjustment of automatic implantable cardiac defibrillator 03/02/2008  Medtronic [id number] SN: [id number] Lemmie Evens [id number] V, [id number], [id number] V, and second bipolar LV lead, implanted at [facility] by Dr. Willis Modena (elevated LV lead threshold) 03/02/2008  . H/O echocardiogram  EF=40-50% with CRTD in situ, RVSP=11mmHg  . HTN (hypertension)  . Hyperlipidemia, unspecified  . Left bundle branch block  . Malignant neoplasm of upper-outer quadrant of left female breast (CMS-HCC) 02/07/2015  . NICM (nonischemic cardiomyopathy) (CMS-HCC) 2010  EF=30%, NICM and LBBB s/p BiV ICD 2010 at [facility], most recent EF with CRTD =40-50%  . S/P hysterectomy  h/o endometrial carcinoma, s/p TAH; BSO  . Seasonal allergies 2009     Patient Active Problem List   Diagnosis Date Noted  . Cough with hemoptysis 12/19/2019    No past surgical history on file.   OB History   No obstetric history on file.     No family history on file. FHx: Family History  Problem Relation Age of Onset  . Heart failure Mother  . Lung cancer Father  . Cancer  Sister  . Stroke Sister  . Sudden death (unexpected death due to unknown cause) Son  Social History   Tobacco Use  . Smoking status: Never Smoker  . Smokeless tobacco: Never Used  Substance Use Topics  . Alcohol use: Never  . Drug use: Never    Home Medications Prior to Admission medications   Medication Sig Start Date End Date Taking? Authorizing Provider  aspirin 81 MG EC tablet Take 81 mg by mouth daily.     [provider]  atorvastatin (LIPITOR) 40 MG tablet Take 40 mg by mouth daily.  06/28/11   [provider]  carvedilol (COREG) 25 MG tablet Take 25 mg by mouth.     [provider]  diltiazem  (CARDIZEM SR) 60 MG 12 hr capsule Take by mouth at bedtime. 12/07/19   [provider]  diphenhydrAMINE (BENADRYL) 25 MG tablet Take 25 mg by mouth at bedtime as needed.    [provider]  ENTRESTO 49-51 MG Take by mouth at bedtime. 12/07/19   [provider]  fenofibrate micronized (LOFIBRA) 67 MG capsule Take 67 mg by mouth daily before breakfast.  06/18/11   [provider]  fexofenadine (ALLEGRA) 180 MG tablet Take 180 mg by mouth daily.     [provider]  HYDROcodone-acetaminophen (NORCO/VICODIN) 5-325 MG tablet Take 1 tablet by mouth every 6 (six) hours as needed for severe pain. 12/20/19   Manuella Ghazi, Pratik D, DO  insulin glargine (LANTUS) 100 UNIT/ML injection Inject 20 Units into the skin at bedtime.     [provider]  lisinopril (ZESTRIL) 10 MG tablet lisinopril 10 mg tablet  Take 1 tablet every day by oral route. Patient not taking: Reported on 12/19/2019    [provider]  Menthol, Topical Analgesic, (BIOFREEZE ROLL-ON COLORLESS) 4 % GEL Apply 1 application topically daily as needed.    [provider]  metFORMIN (GLUCOPHAGE) 1000 MG tablet Take 1,000 mg by mouth daily with breakfast.     [provider]  metoprolol tartrate (LOPRESSOR) 25 MG tablet metoprolol tartrate 25 mg tablet    [provider]  mometasone (NASONEX) 50 MCG/ACT nasal spray 2 sprays daily. 08/05/19   [provider]  pseudoephedrine (SUDAFED) 120 MG 12 hr tablet Take 120 mg by mouth 2 (two) times daily.    [provider]  QUEtiapine (SEROQUEL) 25 MG tablet Take 25 mg by mouth at bedtime. 12/07/19   [provider]    Allergies    Patient has no known allergies.  Review of Systems   Review of Systems  Respiratory: Positive for cough.        Hemoptysis  Musculoskeletal:       Right hip pain  All other systems reviewed and are negative.   Physical Exam Updated Vital Signs BP 119/60   Pulse 79    Temp 98.8 F (37.1 C) (Oral)   Resp (!) 23   SpO2 94%   Physical Exam Vitals and nursing note reviewed.  Constitutional:      Appearance: Normal appearance.  HENT:     Head: Normocephalic and atraumatic.     Right Ear: External ear normal.     Left Ear: External ear normal.     Nose: Nose normal.     Mouth/Throat:     Mouth: Mucous membranes are moist.     Pharynx: Oropharynx is clear.  Eyes:     Extraocular Movements: Extraocular movements intact.     Conjunctiva/sclera: Conjunctivae  normal.     Pupils: Pupils are equal, round, and reactive to light.  Cardiovascular:     Rate and Rhythm: Normal rate and regular rhythm.     Pulses: Normal pulses.     Heart sounds: Normal heart sounds.  Pulmonary:     Effort: Pulmonary effort is normal.     Breath sounds: Normal breath sounds.  Abdominal:     General: Abdomen is flat. Bowel sounds are normal.     Palpations: Abdomen is soft.  Musculoskeletal:     Cervical back: Normal range of motion and neck supple.       Legs:  Skin:    General: Skin is warm.     Capillary Refill: Capillary refill takes less than 2 seconds.  Neurological:     Mental Status: She is alert. Mental status is at baseline.  Psychiatric:        Mood and Affect: Mood normal.        Behavior: Behavior normal.        Thought Content: Thought content normal.        Judgment: Judgment normal.     ED Results / Procedures / Treatments   Labs (all labs ordered are listed, but only abnormal results are displayed) Labs Reviewed  COMPREHENSIVE METABOLIC PANEL - Abnormal; Notable for the following components:      Result Value   Glucose, Bld 170 (*)    BUN 29 (*)    Total Protein 6.4 (*)    Albumin 2.5 (*)    Alkaline Phosphatase 465 (*)    Total Bilirubin 2.0 (*)    GFR, Estimated 59 (*)    All other components within normal limits  CBC WITH DIFFERENTIAL/PLATELET - Abnormal; Notable for the following components:   WBC 11.1 (*)    RBC 3.80 (*)     Hemoglobin 11.2 (*)    HCT 35.5 (*)    Neutro Abs 8.1 (*)    Abs Immature Granulocytes 0.11 (*)    All other components within normal limits  PROTIME-INR - Abnormal; Notable for the following components:   Prothrombin Time 46.0 (*)    INR 5.1 (*)    All other components within normal limits  D-DIMER, QUANTITATIVE (NOT AT [facility]) - Abnormal; Notable for the following components:   D-Dimer, Quant 1.15 (*)    All other components within normal limits  RESPIRATORY PANEL BY RT PCR (FLU A&B, COVID)  URINALYSIS, ROUTINE W REFLEX MICROSCOPIC    EKG EKG Interpretation  Date/Time:  Wednesday December 22 2019 18:12:35 EST Ventricular Rate:  73 PR Interval:    QRS Duration: 136 QT Interval:  421 QTC Calculation: 464 R Axis:   -116 Text Interpretation: paced Confirmed by Isla Pence [phone]) on 12/22/2019 6:34:26 PM   Radiology DG Chest 2 View  Result Date: 12/22/2019 CLINICAL DATA:  84 year old female with cough. Inferior pubic ramus fracture. Right leg pain. EXAM: CHEST - 2 VIEW COMPARISON:  Portable chest 12/19/2019. report of rib series 07/27/2013. FINDINGS: Upright AP and lateral views of the chest. Moderate elevation of the right hemidiaphragm is stable, and also was described in 2015. Associated right lung base atelectasis. No superimposed pneumothorax, pulmonary edema, definite consolidation. However, best seen on the lateral view is density tracking along the major fissure, probably related to small pleural effusion. Stable left chest cardiac AICD. Stable cardiac size and mediastinal contours. Visualized tracheal air column is within normal limits. Calcified aortic atherosclerosis. Bulky endplate spurring in the spine. Negative visible bowel  gas pattern. IMPRESSION: 1. 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 2. New small pleural effusions suspected on the right, and tracking into the major  fissure. Electronically Signed   By: Genevie Ann M.D.   On: 12/22/2019 18:58   CT Angio Chest PE W and/or Wo Contrast  Result Date: 12/22/2019 CLINICAL DATA:  PE suspected.  High pretest probability. EXAM: CT ANGIOGRAPHY CHEST WITH CONTRAST TECHNIQUE: Multidetector CT imaging of the chest was performed using the standard protocol during bolus administration of intravenous contrast. Multiplanar CT image reconstructions and MIPs were obtained to evaluate the vascular anatomy. CONTRAST:  62mL OMNIPAQUE IOHEXOL 350 MG/ML SOLN COMPARISON:  None. FINDINGS: Cardiovascular: Contrast injection is sufficient to demonstrate satisfactory opacification of the pulmonary arteries to the segmental level. There is no pulmonary embolus or evidence of right heart strain. The size of the main pulmonary artery is normal. Cardiomegaly with coronary artery calcification. The course and caliber of the aorta are normal. There is mild atherosclerotic calcification. Opacification decreased due to pulmonary arterial phase contrast bolus timing. Mediastinum/Nodes: -- No mediastinal lymphadenopathy. -- No hilar lymphadenopathy. -- No axillary lymphadenopathy. -- No supraclavicular lymphadenopathy. -- Normal thyroid gland where visualized. -  Unremarkable esophagus. Lungs/Pleura: There is significant elevation of the right hemidiaphragm. Ther there is significant volume loss involving the right lower lobe. Bibasilar airspace consolidation is noted. There is consolidation in the posterior right upper lobe. There is no pneumothorax. The trachea is unremarkable. There are trace bilateral pleural effusions. Upper Abdomen: Contrast bolus timing is not optimized for evaluation of the abdominal organs. The visualized portions of the organs of the upper abdomen are normal. Musculoskeletal: No chest wall abnormality. No bony spinal canal stenosis. Review of the MIP images confirms the above findings. IMPRESSION: 1. No acute pulmonary embolism. 2.  Multifocal airspace consolidation, concerning for multifocal pneumonia or aspiration. 3. Trace bilateral pleural effusions. 4. Cardiomegaly with coronary artery disease. Aortic Atherosclerosis (ICD10-I70.0). Electronically Signed   By: Constance Holster M.D.   On: 12/22/2019 21:50    Procedures Procedures (including critical care time)  Medications Ordered in ED Medications  cefTRIAXone (ROCEPHIN) 1 g in sodium chloride 0.9 % 100 mL IVPB (has no administration in time range)  azithromycin (ZITHROMAX) 500 mg in sodium chloride 0.9 % 250 mL IVPB (has no administration in time range)  HYDROcodone-acetaminophen (NORCO/VICODIN) 5-325 MG per tablet 1 tablet (1 tablet Oral Given 12/22/19 1813)  iohexol (OMNIPAQUE) 350 MG/ML injection 75 mL (75 mLs Intravenous Contrast Given 12/22/19 2142)    ED Course  I have reviewed the triage vital signs and the nursing notes.  Pertinent labs & imaging results that were available during my care of the patient were reviewed by me and considered in my medical decision making (see chart for details).    MDM Rules/Calculators/A&P                          Pt's oxygen was down to 87% on RA when I went in the room.  She was put on 2L oxygen via [place].  Somehow pt's INR is back up to 5.1.  Her daughter said she has not taken it since d/c from the hospital on 11/8. She is not having any active bleeding, so I did not give her vitamin K.  Pt has not been moving much since she hurt her pelvis which is why she developed her pneumonia.  Pt  may need a short stay in rehab.  Pt d/w Dr. Posey Pronto (triad) for admission.  Covid neg.  CRITICAL CARE Performed by: Isla Pence   Total critical care time: 30 minutes  Critical care time was exclusive of separately billable procedures and treating other patients.  Critical care was necessary to treat or prevent imminent or life-threatening deterioration.  Critical care was time spent personally by me on the following  activities: development of treatment plan with patient and/or surrogate as well as nursing, discussions with consultants, evaluation of patient's response to treatment, examination of patient, obtaining history from patient or surrogate, ordering and performing treatments and interventions, ordering and review of laboratory studies, ordering and review of radiographic studies, pulse oximetry and re-evaluation of patient's condition.  Final Clinical Impression(s) / ED Diagnoses Final diagnoses:  Multifocal pneumonia  Supratherapeutic INR    Rx / DC Orders ED Discharge Orders    None       Isla Pence, MD 12/25/19 [phone]

## 2019-12-22 NOTE — ED Triage Notes (Signed)
Pt here for eval of continued R leg pain since known inferior pubic ramus fracture. Pain continues, making it hard for patient to walk. Daughter also reports pt is coughing up blood, for which she was already evaluated and hospitalized at [facility] from 11/7-8.

## 2019-12-22 NOTE — H&P (Signed)
History and Physical    Kathryn Sloan JZP:[id number] DOB: 02-17-1935 DOA: 12/22/2019  PCP: [facility]  Patient coming from: Home  I have personally briefly reviewed patient's old medical records in [place]  Chief Complaint: Hypoxia  HPI: Kathryn Sloan is a 84 y.o. female with medical history significant for chronic systolic CHF, LBBB, NICM, s/p BiV ICD, T2DM, HTN, HLD, chronic anticoagulation with Coumadin (? Hx of A. fib), breast cancer s/p left breast lumpectomy, endometrial cancer s/p TAH/BSO, dementia, and recent pubic rami fracture who presents to the ED for evaluation of cough and right leg weakness.  History is limited from patient due to dementia and is therefore supplemented by EDP, chart review, and daughter by phone.  Patient was recently admitted to [facility] 12/19/2019-12/20/2018 for hypothesis in the setting of chronic Coumadin use and supratherapeutic INR >10.0. She was given IV vitamin K 2.5 mg with repeat INR improved to 2.0. She was discharged home with recommendation to hold further Coumadin until follow-up with PCP.  Daughter states that since her pelvic fracture she has had decreased mobility due to pain with ambulation and fear of falling.  She has been more bedbound with decreased oral intake.  Recently she has been coughing and daughter is concerned she may have aspirated some content.  She has had productive sputum with small spots of blood but not as significant as her most recent hospitalization.  Daughter also states that she is concerned about patient's multiple medications at home and which medications need to be continued and which can be discontinued.  Daughter states that patient has been on anticoagulation with Coumadin for a long period of time.  She is unaware of any history of irregular heart rhythm such as atrial fibrillation or atrial flutter, mechanical valve replacement, or history of blood clots.  ED  Course:  Initial vitals showed BP 139/72, pulse 79, RR 18, temp 98.3 Fahrenheit, SPO2 94% on room air. Per EDP, SPO2 dropped to 87% on room air while at rest and patient was placed on 2 L of O2 via [place].  Patient has been tachypneic with RR up to 25.  Labs show INR 5.1, WBC 11.1, hemoglobin 11.2, platelets 295,000, sodium 137, potassium 4.1, bicarb 25, BUN 29, creatinine 0.96, serum glucose 170, AST 35, ALT 29, alk phos 465, total bilirubin 2.0, D-dimer 1.15.  SARS-CoV-2 PCR is ordered and pending.  2 view chest x-ray shows chronic elevation of the right hemidiaphragm with new small pleural effusions.  CTA chest PE study was negative for acute PE. Multifocal airspace consolidation was seen as well as trace bilateral pleural effusions.  Patient was given IV ceftriaxone and azithromycin. The hospitalist service was consulted to admit for further evaluation and management.  Review of Systems:  Unable to obtain full review of systems due to patient's dementia.   Past Medical History:  Diagnosis Date  . Chronic systolic CHF (congestive heart failure) ([place])   . Diabetes mellitus without complication ([place])   . Hyperlipidemia associated with type 2 diabetes mellitus ([place])   . Hypertension associated with diabetes ([place])   . ICD (implantable cardioverter-defibrillator) in place   . LBBB (left bundle branch block)   . NICM (nonischemic cardiomyopathy) [facility])     Past Surgical History:  Procedure Laterality Date  . TOTAL ABDOMINAL HYSTERECTOMY W/ BILATERAL SALPINGOOPHORECTOMY      Social History:  reports that she has never smoked. She has never used smokeless tobacco. She reports that  she does not drink alcohol and does not use drugs.  No Known Allergies  Family History  Problem Relation Age of Onset  . Heart failure Mother   . Lung cancer Father      Prior to Admission medications   Medication Sig Start Date End Date Taking? Authorizing Provider  Ascorbic Acid (VITAMIN C) 1000 MG  tablet Take 1,000 mg by mouth daily.   Yes [provider]  atorvastatin (LIPITOR) 40 MG tablet Take 40 mg by mouth daily.  06/28/11  Yes [provider]  carvedilol (COREG) 25 MG tablet Take 25 mg by mouth 2 (two) times daily with a meal.    Yes [provider]  diltiazem (CARDIZEM SR) 60 MG 12 hr capsule Take 60 mg by mouth at bedtime.  12/07/19  Yes [provider]  diphenhydrAMINE (BENADRYL) 25 MG tablet Take 25 mg by mouth at bedtime.   Yes [provider]  ENTRESTO 49-51 MG Take 1 tablet by mouth at bedtime.  12/07/19  Yes [provider]  fexofenadine (ALLEGRA) 180 MG tablet Take 180 mg by mouth daily.    Yes [provider]  HYDROcodone-acetaminophen (NORCO/VICODIN) 5-325 MG tablet Take 1 tablet by mouth every 6 (six) hours as needed for severe pain. 12/20/19  Yes Shah, Pratik D, DO  insulin glargine (LANTUS) 100 UNIT/ML injection Inject 20 Units into the skin at bedtime.    Yes [provider]  metFORMIN (GLUCOPHAGE-XR) 500 MG 24 hr tablet Take 500 mg by mouth in the morning and at bedtime.   Yes [provider]  mometasone (NASONEX) 50 MCG/ACT nasal spray Place 2 sprays into the nose daily as needed (allergies).  08/05/19  Yes [provider]  montelukast (SINGULAIR) 10 MG tablet Take 10 mg by mouth at bedtime.   Yes [provider]  ondansetron (ZOFRAN) 4 MG tablet Take 4 mg by mouth every 8 (eight) hours as needed. 12/21/19  Yes [provider]  pseudoephedrine (SUDAFED) 120 MG 12 hr tablet Take 120 mg by mouth 2 (two) times daily.   Yes [provider]  QUEtiapine (SEROQUEL) 25 MG tablet Take 25 mg by mouth at bedtime. 12/07/19  Yes [provider]  spironolactone (ALDACTONE) 25 MG tablet Take 25 mg by mouth 2 (two) times daily.   Yes [provider]  venlafaxine XR (EFFEXOR-XR) 75 MG 24 hr capsule Take 75 mg by mouth daily with breakfast.   Yes [provider]    Physical Exam: Vitals:   12/22/19 2111 12/22/19 2130 12/22/19 2200 12/22/19 2230  BP:  135/70 134/70 114/83  Pulse:  81 84 83  Resp:  (!) 27 (!) 25 (!) 22  Temp:      TempSrc:      SpO2: 94% 96% 95% 94%   Constitutional: Elderly woman resting supine in bed, NAD, calm, comfortable Eyes: PERRL, lids and conjunctivae normal ENMT: Mucous membranes are moist. Posterior pharynx clear of any exudate or lesions.Normal dentition.  Neck: normal, supple, no masses. Respiratory: clear to auscultation anteriorly.  Normal respiratory effort. No accessory muscle use.  Cardiovascular: Regular rate and rhythm, no murmurs / rubs / gallops. No extremity edema. 2+ pedal pulses.  ICD in place left chest wall. Abdomen: no tenderness, no masses palpated. No hepatosplenomegaly. Bowel sounds positive.  Musculoskeletal: no clubbing / cyanosis. No joint deformity upper and lower extremities.  ROM slightly diminished at right hip otherwise good ROM, no contractures. Normal muscle tone.  Skin: no rashes, lesions,  ulcers. No induration Neurologic: CN 2-12 grossly intact. Sensation intact, Strength slightly limited at right hip but largely 5/5 in all 4 extremities while resting in bed.  Psychiatric: Awake, alert, and pleasant.  She is oriented to self.  She says she is currently in to hospital that the year is 2002.  She can tell me her daughter's name.  She is not clear as to why she is in the hospital.   Labs on Admission: I have personally reviewed following labs and imaging studies  CBC: Recent Labs  Lab 12/19/19 1235 12/20/19 1330 12/22/19 1806  WBC 12.4* 13.9* 11.1*  NEUTROABS 10.1*  --  8.1*  HGB 11.7* 11.1* 11.2*  HCT 35.6* 34.8* 35.5*  MCV 91.0 93.3 93.4  PLT 236 272 169   Basic Metabolic Panel: Recent Labs  Lab 12/19/19 1235 12/20/19 1330 12/22/19 1806  NA 135 137 137  K 3.9 3.9 4.1  CL 104 105 104  CO2 22 22 25   GLUCOSE 185* 196* 170*  BUN 36* 27* 29*  CREATININE  1.04* 0.75 0.96  CALCIUM 9.0 9.1 9.4   GFR: Estimated Creatinine Clearance: 42.4 mL/min (by C-G formula based on SCr of 0.96 mg/dL). Liver Function Tests: Recent Labs  Lab 12/19/19 1235 12/22/19 1806  AST 23 35  ALT 23 29  ALKPHOS 361* 465*  BILITOT 1.7* 2.0*  PROT 6.6 6.4*  ALBUMIN 3.2* 2.5*   No results for input(s): LIPASE, AMYLASE in the last 168 hours. No results for input(s): AMMONIA in the last 168 hours. Coagulation Profile: Recent Labs  Lab 12/19/19 1235 12/20/19 1330 12/22/19 1806  INR >10.0* 2.0* 5.1*   Cardiac Enzymes: No results for input(s): CKTOTAL, CKMB, CKMBINDEX, TROPONINI in the last 168 hours. BNP (last 3 results) No results for input(s): PROBNP in the last 8760 hours. HbA1C: No results for input(s): HGBA1C in the last 72 hours. CBG: No results for input(s): GLUCAP in the last 168 hours. Lipid Profile: No results for input(s): CHOL, HDL, LDLCALC, TRIG, CHOLHDL, LDLDIRECT in the last 72 hours. Thyroid Function Tests: No results for input(s): TSH, T4TOTAL, FREET4, T3FREE, THYROIDAB in the last 72 hours. Anemia Panel: No results for input(s): VITAMINB12, FOLATE, FERRITIN, TIBC, IRON, RETICCTPCT in the last 72 hours. Urine analysis: No results found for: COLORURINE, APPEARANCEUR, LABSPEC, PHURINE, GLUCOSEU, HGBUR, BILIRUBINUR, KETONESUR, PROTEINUR, UROBILINOGEN, NITRITE, LEUKOCYTESUR  Radiological Exams on Admission: DG Chest 2 View  Result Date: 12/22/2019 CLINICAL DATA:  83 year old female with cough. Inferior pubic ramus fracture. Right leg pain. EXAM: CHEST - 2 VIEW COMPARISON:  Portable chest 12/19/2019. report of rib series 07/27/2013. FINDINGS: Upright AP and lateral views of the chest. Moderate elevation of the right hemidiaphragm is stable, and also was described in 2015. Associated right lung base atelectasis. No superimposed pneumothorax, pulmonary edema, definite consolidation. However, best seen on the lateral view is density tracking along  the major fissure, probably related to small pleural effusion. Stable left chest cardiac AICD. Stable cardiac size and mediastinal contours. Visualized tracheal air column is within normal limits. Calcified aortic atherosclerosis. Bulky endplate spurring in the spine. Negative visible bowel gas pattern. IMPRESSION: 1. 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 2. New small pleural effusions suspected on the right, and tracking into the major fissure. Electronically Signed   By: Genevie Ann M.D.   On: 12/22/2019 18:58   CT Angio Chest PE W and/or Wo Contrast  Result Date: 12/22/2019 CLINICAL DATA:  PE  suspected.  High pretest probability. EXAM: CT ANGIOGRAPHY CHEST WITH CONTRAST TECHNIQUE: Multidetector CT imaging of the chest was performed using the standard protocol during bolus administration of intravenous contrast. Multiplanar CT image reconstructions and MIPs were obtained to evaluate the vascular anatomy. CONTRAST:  5m OMNIPAQUE IOHEXOL 350 MG/ML SOLN COMPARISON:  None. FINDINGS: Cardiovascular: Contrast injection is sufficient to demonstrate satisfactory opacification of the pulmonary arteries to the segmental level. There is no pulmonary embolus or evidence of right heart strain. The size of the main pulmonary artery is normal. Cardiomegaly with coronary artery calcification. The course and caliber of the aorta are normal. There is mild atherosclerotic calcification. Opacification decreased due to pulmonary arterial phase contrast bolus timing. Mediastinum/Nodes: -- No mediastinal lymphadenopathy. -- No hilar lymphadenopathy. -- No axillary lymphadenopathy. -- No supraclavicular lymphadenopathy. -- Normal thyroid gland where visualized. -  Unremarkable esophagus. Lungs/Pleura: There is significant elevation of the right hemidiaphragm. Ther there is significant volume loss involving the right lower lobe. Bibasilar  airspace consolidation is noted. There is consolidation in the posterior right upper lobe. There is no pneumothorax. The trachea is unremarkable. There are trace bilateral pleural effusions. Upper Abdomen: Contrast bolus timing is not optimized for evaluation of the abdominal organs. The visualized portions of the organs of the upper abdomen are normal. Musculoskeletal: No chest wall abnormality. No bony spinal canal stenosis. Review of the MIP images confirms the above findings. IMPRESSION: 1. No acute pulmonary embolism. 2. Multifocal airspace consolidation, concerning for multifocal pneumonia or aspiration. 3. Trace bilateral pleural effusions. 4. Cardiomegaly with coronary artery disease. Aortic Atherosclerosis (ICD10-I70.0). Electronically Signed   By: CConstance HolsterM.D.   On: 12/22/2019 21:50    EKG: Personally reviewed. V paced rhythm. No prior for comparison.  Assessment/Plan Principal Problem:   Acute respiratory failure with hypoxia (HCC) Active Problems:   Chronic systolic CHF (congestive heart failure) (HCC)   Diabetes mellitus without complication (HCC)   Hyperlipidemia associated with type 2 diabetes mellitus ([place]   Hypertension associated with diabetes ([place]   ICD (implantable cardioverter-defibrillator) in place   Closed fracture of multiple pubic rami ([place]   Supratherapeutic INR  HJALAYSIA LOBBis a 84y.o. female with medical history significant for chronic systolic CHF, LBBB, NICM, s/p BiV ICD, T2DM, HTN, HLD, chronic anticoagulation with Coumadin (? Hx of A. fib), breast cancer s/p left breast lumpectomy, endometrial cancer s/p TAH/BSO, dementia, and recent pubic rami fracture who is admitted with hypoxia and supratherapeutic INR.  Acute respiratory failure with hypoxia Aspiration pneumonia: Per daughter, concern for possible aspiration event at home resulting in aspiration pneumonia versus pneumonitis. -Continue empiric IV Unasyn -Continue supplemental oxygen as  needed -Aspiration precautions, SLP eval  Cough with hemoptysis Chronic anticoagulation with supratherapeutic INR: Recent admit for hemoptysis in setting of supratherapeutic INR.  She is on chronic Coumadin for unspecified diagnosis.  INR 5.1 on admit. -Give oral vitamin K 2.5 mg once and repeat INR in a.m. -Hold Coumadin, not clear that there is an indication for continued anticoagulation  Right inferior and superior pubic rami fractures: Seen on CT imaging 12/11/2019 after mechanical fall 3 days prior. -Continue pain control with Tylenol and Norco as needed with hold parameters -PT/OT eval  Chronic systolic CHF/LBBB/NICM S/p BiV ICD: No recent echocardiogram on file.  Appears euvolemic on admission without acute issues.  Not requiring diuretics as an outpatient. -Continue Entresto, Coreg, spironolactone  T2DM: Hold home Metformin.  Place on reduced home Lantus 10 units qhs and add sensitive SSI.  Check A1c.  HTN: BP currently stable.  Continue home Entresto, Coreg, spironolactone.  Holding diltiazem for now.  HLD: Continue atorvastatin.  Dementia: Continue home Seroquel and Effexor.  Delirium precautions.  DVT prophylaxis: SCDs Code Status: Full code, confirmed with patient's daughter Family Communication: Discussed with patient's daughter, Celesta Gentile, by phone [phone] Disposition Plan: From home, may need SNF on discharge Consults called: None Admission status:  Status is: Inpatient  Remains inpatient appropriate because:Unsafe d/c plan, IV treatments appropriate due to intensity of illness or inability to take PO and Inpatient level of care appropriate due to severity of illness   Dispo: The patient is from: Home              Anticipated d/c is to: Home versus SNF              Anticipated d/c date is: 2 days              Patient currently is not medically stable to d/c.   Zada Finders MD [facility]  If 7PM-7AM, please contact  night-coverage [email or web address]  12/22/2019, 11:04 PM

## 2019-12-23 ENCOUNTER — Other Ambulatory Visit: Payer: Self-pay

## 2019-12-23 ENCOUNTER — Encounter (HOSPITAL_COMMUNITY): Payer: Self-pay | Admitting: Internal Medicine

## 2019-12-23 DIAGNOSIS — F0390 Unspecified dementia without behavioral disturbance: Secondary | ICD-10-CM

## 2019-12-23 DIAGNOSIS — J9601 Acute respiratory failure with hypoxia: Secondary | ICD-10-CM | POA: Diagnosis not present

## 2019-12-23 DIAGNOSIS — J189 Pneumonia, unspecified organism: Principal | ICD-10-CM

## 2019-12-23 DIAGNOSIS — Z515 Encounter for palliative care: Secondary | ICD-10-CM

## 2019-12-23 DIAGNOSIS — Z7189 Other specified counseling: Secondary | ICD-10-CM

## 2019-12-23 LAB — CBC
HCT: 32.6 % — ABNORMAL LOW (ref 36.0–46.0)
Hemoglobin: 10.3 g/dL — ABNORMAL LOW (ref 12.0–15.0)
MCH: 29.6 pg (ref 26.0–34.0)
MCHC: 31.6 g/dL (ref 30.0–36.0)
MCV: 93.7 fL (ref 80.0–100.0)
Platelets: 279 10*3/uL (ref 150–400)
RBC: 3.48 MIL/uL — ABNORMAL LOW (ref 3.87–5.11)
RDW: 14.6 % (ref 11.5–15.5)
WBC: 11.9 10*3/uL — ABNORMAL HIGH (ref 4.0–10.5)
nRBC: 0 % (ref 0.0–0.2)

## 2019-12-23 LAB — BASIC METABOLIC PANEL
Anion gap: 10 (ref 5–15)
BUN: 24 mg/dL — ABNORMAL HIGH (ref 8–23)
CO2: 23 mmol/L (ref 22–32)
Calcium: 9.1 mg/dL (ref 8.9–10.3)
Chloride: 108 mmol/L (ref 98–111)
Creatinine, Ser: 0.76 mg/dL (ref 0.44–1.00)
GFR, Estimated: >60 mL/min (ref >60)
Glucose, Bld: 129 mg/dL — ABNORMAL HIGH (ref 70–99)
Potassium: 3.6 mmol/L (ref 3.5–5.1)
Sodium: 141 mmol/L (ref 135–145)

## 2019-12-23 LAB — URINALYSIS, ROUTINE W REFLEX MICROSCOPIC
Glucose, UA: NEGATIVE mg/dL
Hgb urine dipstick: NEGATIVE
Ketones, ur: NEGATIVE mg/dL
Nitrite: POSITIVE — AB
Protein, ur: NEGATIVE mg/dL
Specific Gravity, Urine: >1.046 — ABNORMAL HIGH (ref 1.005–1.030)
pH: 6 (ref 5.0–8.0)

## 2019-12-23 LAB — CBG MONITORING, ED
Glucose-Capillary: 144 mg/dL — ABNORMAL HIGH (ref 70–99)
Glucose-Capillary: 149 mg/dL — ABNORMAL HIGH (ref 70–99)

## 2019-12-23 LAB — GLUCOSE, CAPILLARY
Glucose-Capillary: 173 mg/dL — ABNORMAL HIGH (ref 70–99)
Glucose-Capillary: 121 mg/dL — ABNORMAL HIGH (ref 70–99)

## 2019-12-23 LAB — PROTIME-INR
INR: 6 (ref 0.8–1.2)
Prothrombin Time: 51.5 seconds — ABNORMAL HIGH (ref 11.4–15.2)

## 2019-12-23 LAB — HEMOGLOBIN A1C
Hgb A1c MFr Bld: 6.3 % — ABNORMAL HIGH (ref 4.8–5.6)
Mean Plasma Glucose: 134.11 mg/dL

## 2019-12-23 MED ORDER — ATORVASTATIN CALCIUM 40 MG PO TABS
40.0000 mg | ORAL_TABLET | Freq: Every day | ORAL | Status: DC
  Administered 2019-12-23: 40 mg via ORAL
  Filled 2019-12-23: qty 1

## 2019-12-23 MED ORDER — GUAIFENESIN 100 MG/5ML PO SOLN
5.0000 mL | Freq: Once | ORAL | Status: AC
  Administered 2019-12-23: 100 mg via ORAL
  Filled 2019-12-23: qty 5

## 2019-12-23 MED ORDER — QUETIAPINE FUMARATE 25 MG PO TABS
25.0000 mg | ORAL_TABLET | Freq: Every day | ORAL | Status: DC
  Administered 2019-12-23 (×2): 25 mg via ORAL
  Filled 2019-12-23 (×3): qty 1

## 2019-12-23 MED ORDER — SACUBITRIL-VALSARTAN 49-51 MG PO TABS
1.0000 | ORAL_TABLET | Freq: Every day | ORAL | Status: DC
  Administered 2019-12-23: 1 via ORAL
  Filled 2019-12-23 (×4): qty 1

## 2019-12-23 MED ORDER — MONTELUKAST SODIUM 10 MG PO TABS
10.0000 mg | ORAL_TABLET | Freq: Every day | ORAL | Status: DC
  Administered 2019-12-23: 10 mg via ORAL
  Filled 2019-12-23: qty 1

## 2019-12-23 MED ORDER — DILTIAZEM HCL ER 60 MG PO CP12
60.0000 mg | ORAL_CAPSULE | Freq: Every day | ORAL | Status: DC
  Administered 2019-12-23: 60 mg via ORAL
  Filled 2019-12-23 (×3): qty 1

## 2019-12-23 MED ORDER — VENLAFAXINE HCL ER 75 MG PO CP24
75.0000 mg | ORAL_CAPSULE | Freq: Every day | ORAL | Status: DC
  Administered 2019-12-23 – 2019-12-24 (×2): 75 mg via ORAL
  Filled 2019-12-23 (×3): qty 1

## 2019-12-23 MED ORDER — INSULIN ASPART 100 UNIT/ML ~~LOC~~ SOLN
0.0000 [IU] | Freq: Three times a day (TID) | SUBCUTANEOUS | Status: DC
  Administered 2019-12-23 (×2): 2 [IU] via SUBCUTANEOUS
  Administered 2019-12-24: 1 [IU] via SUBCUTANEOUS

## 2019-12-23 MED ORDER — SPIRONOLACTONE 25 MG PO TABS
25.0000 mg | ORAL_TABLET | Freq: Two times a day (BID) | ORAL | Status: DC
  Administered 2019-12-23 – 2019-12-24 (×3): 25 mg via ORAL
  Filled 2019-12-23 (×5): qty 1

## 2019-12-23 MED ORDER — INSULIN GLARGINE 100 UNIT/ML ~~LOC~~ SOLN
10.0000 [IU] | Freq: Every day | SUBCUTANEOUS | Status: DC
  Administered 2019-12-23: 10 [IU] via SUBCUTANEOUS
  Filled 2019-12-23 (×2): qty 0.1

## 2019-12-23 MED ORDER — CARVEDILOL 25 MG PO TABS
25.0000 mg | ORAL_TABLET | Freq: Two times a day (BID) | ORAL | Status: DC
  Administered 2019-12-23 – 2019-12-24 (×2): 25 mg via ORAL
  Filled 2019-12-23 (×4): qty 1

## 2019-12-23 NOTE — Consult Note (Signed)
Consultation Note Date: 12/23/2019   Patient Name: Kathryn Sloan  DOB: Nov 09, 1935  MRN: [id number]  Age / Sex: 84 y.o., female  PCP: [facility] Referring Physician: Darliss Cheney, MD  Reason for Consultation: Establishing goals of care  HPI/Patient Profile: 84 y.o. female  with past medical history of chronic systolic CHF, NICM s/p ICD, diabetes type 2, HTN, hyperlipidemia, chronic anticoagulation with Coumadin (? Hx of A-fib), dementia, history of breast cancer (s/p lumpectomy), and endometrial cancer s/p TAH/BSO. She also has a recent rami fracture secondary to fall 12/08/19. She presented to the [facility] emergency department on 12/22/2019 with cough and right leg weakness. Daughter reports possible aspiration event at home. In the ED, spo2 was 87% on room air. Chest x-ray showed chronic elevation of the right hemidiaphragm with new small pleural effusions. CTA chest was negative for acute PE; showed multifocal airspace consolidation. Patient admitted to [facility] for management of acute hypoxic respiratory failure and possible aspiration pneumonia.   Primary decision maker: Patient can participate in all [place] discussions but needs support with all medical decisions. She states she would want her surrogate decision makers to be daughter [name] and son [name].   Clinical Assessment and Goals of Care: I have reviewed medical records including EPIC notes, labs and imaging, and met at bedside with patient and daughter [name]) to discuss diagnosis, prognosis, GOC, EOL wishes, disposition, and options.  I introduced Palliative Medicine as specialized medical care for people living with serious illness. It focuses on providing relief from the symptoms and stress of a serious illness.   We discussed a brief life review of the patient. She lives in [place]. Her husband passed away several years ago   She has been a very active person all her life. She has 4 living children: [name] (lives with her), [name] ([facility]), [name] (minimally involved), and [name] (lives in [place]).   As far as functional and nutritional status, at baseline patient is ambulatory and independent with basic ADLs. She even does some household tasks and works in the yard at times. Since her pelvic fracture, there has been a decline in mobility and fear of falling. She has been spending most her time in bed and with decreased oral intake.    We discussed her current illness and what it means in the larger context of her ongoing co-morbidities.  Detailed discussion was had regarding the diagnosis of dementia and its natural trajectory. This includes decreased ability to communicate, ambulate, swallow, and maintain continence.   I attempted to elicit values and goals of care important to the patient. She has always values her independence. The main goal is to get her well enough to go home and back to her previous functional status. I provided education and counseling that with any major illness or injury, baseline functional status typically declines to some degree.    The difference between aggressive medical intervention and comfort care was considered in light of the patient's goals of care. The patient and daughter are  interested in all aggressive medical interventions offered. We did discuss code status. Encouraged patient to consider DNR/DNI status understanding evidenced based poor outcomes in similar hospitalized patients, as the cause of the arrest is likely associated with chronic/terminal disease rather than a reversible acute cardio-pulmonary event. Patient and daughter seem to indicate that DNR is appropriate however they are not ready to make that decision yet. Daughter states "she has a lot of living left to do".   Advanced directives, concepts specific to code status, artifical feeding and hydration, and  rehospitalization were considered and discussed. MOST form introduced. Daughter is not ready to complete MOST form at this time.   Patient is already receiving outpatient palliative services. Explained the option of transition to hospice care in the future in the event of further decline.   Questions and concerns were addressed.  The family was encouraged to call with questions or concerns.    SUMMARY OF RECOMMENDATIONS   - full code, full scope treatment - patient is already receiving outpatient palliative services with [facility] - goal of care is to return home and for patient to regain previous functional status - patient/family need ongoing discussions about [place]  This NP will follow up with patient/family when I return to service 11/15, please call [phone] if there are more urgent needs   Code Status/Advance Care Planning:  Full code  Symptom Management:   Per primary team  Palliative Prophylaxis:   Fall precautions  Additional Recommendations (Limitations, Scope, Preferences):  Full scope treatment  Psycho-social/Spiritual:  Created space and opportunity for patient and family to express thoughts and feelings regarding patient's current medical situation.  Emotional support provided  Prognosis:   Unable to determine  Discharge Planning: to be determined, likely home with [facility] and palliative care services      Primary Diagnoses: Present on Admission:  Acute respiratory failure with hypoxia (HCC)  Chronic systolic CHF (congestive heart failure) ([place])  Hyperlipidemia associated with type 2 diabetes mellitus ([place])  Hypertension associated with diabetes ([place])  ICD (implantable cardioverter-defibrillator) in place  Closed fracture of multiple pubic rami ([place])  Supratherapeutic INR  Cough with hemoptysis   I have reviewed the medical record, interviewed the patient and family, and examined the patient. The following aspects are pertinent.  Past  Medical History:  Diagnosis Date   Chronic systolic CHF (congestive heart failure) (HCC)    Diabetes mellitus without complication (HCC)    Hyperlipidemia associated with type 2 diabetes mellitus (HCC)    Hypertension associated with diabetes ([place])    ICD (implantable cardioverter-defibrillator) in place    LBBB (left bundle branch block)    NICM (nonischemic cardiomyopathy) ([place])      Family History  Problem Relation Age of Onset   Heart failure Mother    Lung cancer Father    Scheduled Meds:  atorvastatin  40 mg Oral q1800   carvedilol  25 mg Oral BID WC   diltiazem  60 mg Oral QHS   insulin aspart  0-9 Units Subcutaneous TID WC   insulin glargine  10 Units Subcutaneous QHS   montelukast  10 mg Oral QHS   QUEtiapine  25 mg Oral QHS   sacubitril-valsartan  1 tablet Oral QHS   spironolactone  25 mg Oral BID   venlafaxine XR  75 mg Oral Q breakfast   Continuous Infusions:  ampicillin-sulbactam (UNASYN) IV Stopped (12/23/19 1435)   PRN Meds:.acetaminophen **OR** acetaminophen, HYDROcodone-acetaminophen, ondansetron **OR** ondansetron (ZOFRAN) IV, senna-docusate Medications Prior to Admission:  Prior to  Admission medications   Medication Sig Start Date End Date Taking? Authorizing Provider  Ascorbic Acid (VITAMIN C) 1000 MG tablet Take 1,000 mg by mouth daily.   Yes [provider]  atorvastatin (LIPITOR) 40 MG tablet Take 40 mg by mouth daily.  06/28/11  Yes [provider]  carvedilol (COREG) 25 MG tablet Take 25 mg by mouth 2 (two) times daily with a meal.    Yes [provider]  diltiazem (CARDIZEM SR) 60 MG 12 hr capsule Take 60 mg by mouth at bedtime.  12/07/19  Yes [provider]  diphenhydrAMINE (BENADRYL) 25 MG tablet Take 25 mg by mouth at bedtime.   Yes [provider]  ENTRESTO 49-51 MG Take 1 tablet by mouth at bedtime.  12/07/19  Yes [provider]  fexofenadine (ALLEGRA) 180 MG tablet Take  180 mg by mouth daily.    Yes [provider]  HYDROcodone-acetaminophen (NORCO/VICODIN) 5-325 MG tablet Take 1 tablet by mouth every 6 (six) hours as needed for severe pain. 12/20/19  Yes Shah, Pratik D, DO  insulin glargine (LANTUS) 100 UNIT/ML injection Inject 20 Units into the skin at bedtime.    Yes [provider]  metFORMIN (GLUCOPHAGE-XR) 500 MG 24 hr tablet Take 500 mg by mouth in the morning and at bedtime.   Yes [provider]  mometasone (NASONEX) 50 MCG/ACT nasal spray Place 2 sprays into the nose daily as needed (allergies).  08/05/19  Yes [provider]  montelukast (SINGULAIR) 10 MG tablet Take 10 mg by mouth at bedtime.   Yes [provider]  ondansetron (ZOFRAN) 4 MG tablet Take 4 mg by mouth every 8 (eight) hours as needed. 12/21/19  Yes [provider]  pseudoephedrine (SUDAFED) 120 MG 12 hr tablet Take 120 mg by mouth 2 (two) times daily.   Yes [provider]  QUEtiapine (SEROQUEL) 25 MG tablet Take 25 mg by mouth at bedtime. 12/07/19  Yes [provider]  spironolactone (ALDACTONE) 25 MG tablet Take 25 mg by mouth 2 (two) times daily.   Yes [provider]  venlafaxine XR (EFFEXOR-XR) 75 MG 24 hr capsule Take 75 mg by mouth daily with breakfast.   Yes [provider]   No Known Allergies Review of Systems  Neurological: Positive for weakness.    Physical Exam Vitals reviewed.  Constitutional:      General: She is not in acute distress. HENT:     Head: Normocephalic and atraumatic.  Pulmonary:     Effort: Pulmonary effort is normal.  Neurological:     Mental Status: She is alert.     Comments: Oriented to person and place     Vital Signs: BP 133/75    Pulse 88    Temp (!) 97.2 F (36.2 C)    Resp (!) 25    SpO2 94%  Pain Scale: 0-10 POSS *See Group Information*: 1-Acceptable,Awake and alert Pain Score: 0-No pain   SpO2: SpO2: 94 % O2 Device:SpO2: 94 % O2 Flow Rate:  .O2 Flow Rate (L/min): 2 L/min  IO: Intake/output summary:   Intake/Output Summary (Last 24 hours) at 12/23/2019 1630 Last data filed at 12/23/2019 0130 Gross per 24 hour  Intake 450 ml  Output --  Net 450 ml       Palliative Assessment/Data: PPS 50%     Time In: 16:15 Time Out: 17:30 Time Total: 75 minutes Greater than 50%  of this time was spent counseling and coordinating care related to  the above assessment and plan.  Signed by: Lavena Bullion, NP   Please contact Palliative Medicine Team phone at [phone] for questions and concerns.  For individual provider: See Shea Evans

## 2019-12-23 NOTE — ED Notes (Signed)
Pt sitting on side of bed. NADN. Family at bedside.

## 2019-12-23 NOTE — ED Notes (Signed)
First contact. Change of shift. Pt resting in bed. Maneuvered for comfort. NADN. Updated on POC.

## 2019-12-23 NOTE — Evaluation (Addendum)
Occupational Therapy Evaluation Patient Details Name: Kathryn Sloan MRN: [id number] DOB: 09/20/1935 Today's Date: 12/23/2019    History of Present Illness Pt is 84 yo female with hx of CHF, LBBB, NICM, s/p BiV ICD, DM2, HTN, HLD, breast CA, endometrial CA, dementia, and recent pubic rami fracture who presented to ED with cough and R leg weakness. Pt admitted with acute resp failure with hypoxia.  Additionally, pt with supratherapeutic INR.   Clinical Impression   PTA pt living with daughter and requiring assist for mobility, ADL, and IADL management. Pt has history of dementia as well. At time of eval, pt presents with ability to complete bed mobility at min A level and sit <> stands with min A +2 and RW. She was able to complete mobility with min A +2 for short household distance to demonstrate toilet transfer. At baseline she wears incontinence aides, therefore is overall max A for hygiene. Pt was quite disoriented to situation, place, and time but pleasant and redirectable with increased time and repetition. Given current status, recommend HHOT with 24/7 support from daughter to support safety, BADL engagement, and independent PLOF. OT will continue to follow per POC listed below.    Follow Up Recommendations  Home health OT;Supervision/Assistance - 24 hour    Equipment Recommendations  None recommended by OT    Recommendations for Other Services       Precautions / Restrictions Precautions Precautions: Fall Precaution Comments: Hx elevated INR Restrictions Weight Bearing Restrictions: No Other Position/Activity Restrictions: recent pelvic fracture (10/27)      Mobility Bed Mobility Overal bed mobility: Needs Assistance Bed Mobility: Supine to Sit;Sit to Supine     Supine to sit: Min assist Sit to supine: Min assist   General bed mobility comments: Min A for trunk up and legs back to bed; instructed daughter in methods to assist including draw sheet and keeping legs  together for decreased pain with pelvic fx    Transfers Overall transfer level: Needs assistance Equipment used: 2 person hand held assist Transfers: Sit to/from Stand Sit to Stand: Min assist;+2 physical assistance;+2 safety/equipment         General transfer comment: assist to rise and steady    Balance Overall balance assessment: Needs assistance Sitting-balance support: Feet supported;No upper extremity supported Sitting balance-Leahy Scale: Good     Standing balance support: Bilateral upper extremity supported;During functional activity Standing balance-Leahy Scale: Poor Standing balance comment: reliant on external support                           ADL either performed or assessed with clinical judgement   ADL Overall ADL's : Needs assistance/impaired Eating/Feeding: Set up;Sitting   Grooming: Set up;Sitting   Upper Body Bathing: Minimal assistance;Sitting   Lower Body Bathing: Moderate assistance;Sitting/lateral leans;Sit to/from stand   Upper Body Dressing : Minimal assistance;Sitting   Lower Body Dressing: Moderate assistance;Sitting/lateral leans;Sit to/from stand   Toilet Transfer: Moderate assistance;Stand-pivot;BSC   Toileting- Clothing Manipulation and Hygiene: Maximal assistance;Sitting/lateral lean;Sit to/from stand Toileting - Clothing Manipulation Details (indicate cue type and reason): uses depends at baseline     Functional mobility during ADLs: Min guard;Rolling walker       Vision Patient Visual Report: No change from baseline       Perception     Praxis      Pertinent Vitals/Pain Pain Assessment: Faces Faces Pain Scale: Hurts little more Pain Location: RLE with weight bearing Pain Descriptors / Indicators:  Aching;Sore Pain Intervention(s): Limited activity within patient's tolerance;Monitored during session;Repositioned     Hand Dominance     Extremity/Trunk Assessment Upper Extremity Assessment Upper Extremity  Assessment: Generalized weakness   Lower Extremity Assessment Lower Extremity Assessment: Defer to PT evaluation       Communication Communication Communication: No difficulties   Cognition Arousal/Alertness: Awake/alert Behavior During Therapy: WFL for tasks assessed/performed Overall Cognitive Status: History of cognitive impairments - at baseline Area of Impairment: Orientation;Memory;Safety/judgement;Problem solving                 Orientation Level: Disoriented to;Place;Time;Situation   Memory: Decreased short-term memory   Safety/Judgement: Decreased awareness of safety;Decreased awareness of deficits   Problem Solving: Slow processing;Difficulty sequencing;Requires tactile cues;Requires verbal cues General Comments: pt with history of dementia at baseline. Daughter reports she always has a loose interpretation of time. Requires increased cues and processing time for basic ADL tasks   General Comments       Exercises     Shoulder Instructions      Home Living Family/patient expects to be discharged to:: Private residence Living Arrangements: Children Available Help at Discharge: Family;Available 24 hours/day Type of Home: House Home Access: Level entry     Home Layout: One level     Bathroom Shower/Tub: Teacher, early years/pre: Handicapped height     Home Equipment: Environmental consultant - 2 wheels;Walker - 4 wheels;Cane - quad;Cane - single point;Bedside commode;Transport chair   Additional Comments: also has hoyer      Prior Functioning/Environment Level of Independence: Needs assistance  Gait / Transfers Assistance Needed: Reports typically very active and can ambulate in community.  Since pelvic fracture at the end of October, she has not ambulated much ADL's / Homemaking Assistance Needed: assist for bathing and IADL management from daughter   Comments: loves to go to thrift shops        OT Problem List: Decreased strength;Decreased knowledge  of use of DME or AE;Decreased cognition;Decreased activity tolerance;Impaired balance (sitting and/or standing);Decreased safety awareness      OT Treatment/Interventions: Self-care/ADL training;Therapeutic exercise;Patient/family education;Balance training;Energy conservation;Therapeutic activities;DME and/or AE instruction;Cognitive remediation/compensation    OT Goals(Current goals can be found in the care plan section) Acute Rehab OT Goals Patient Stated Goal: return home per daughter OT Goal Formulation: With patient/family Time For Goal Achievement: 01/06/20 Potential to Achieve Goals: Good  OT Frequency: Min 2X/week   Barriers to D/C:            Co-evaluation PT/OT/SLP Co-Evaluation/Treatment: Yes Reason for Co-Treatment: For patient/therapist safety;To address functional/ADL transfers   OT goals addressed during session: ADL's and self-care;Strengthening/ROM      AM-PAC OT "6 Clicks" Daily Activity     Outcome Measure Help from another person eating meals?: A Little Help from another person taking care of personal grooming?: A Little Help from another person toileting, which includes using toliet, bedpan, or urinal?: A Lot Help from another person bathing (including washing, rinsing, drying)?: A Lot Help from another person to put on and taking off regular upper body clothing?: A Little Help from another person to put on and taking off regular lower body clothing?: A Lot 6 Click Score: 15   End of Session Equipment Utilized During Treatment: Gait belt Nurse Communication: Mobility status  Activity Tolerance: Patient tolerated treatment well Patient left: in bed;with call bell/phone within reach;with family/visitor present  OT Visit Diagnosis: Unsteadiness on feet (R26.81);Other abnormalities of gait and mobility (R26.89);Muscle weakness (generalized) (M62.81);Other symptoms and signs involving  cognitive function                Time: 4314-2767 OT Time Calculation  (min): 32 min Charges:  OT General Charges $OT Visit: 1 Visit OT Evaluation $OT Eval Moderate Complexity: [place], MSOT, OTR/L Acute Rehabilitation Services [facility] Office Number: [phone] Pager: [phone]  Zenovia Jarred 12/23/2019, 6:00 PM

## 2019-12-23 NOTE — ED Notes (Signed)
Lunch Tray Ordered @ 1034. 

## 2019-12-23 NOTE — Progress Notes (Signed)
Manufacturing engineer [facility]) Community Based Palliative Care       This patient is enrolled in our palliative care services in the community. Per Christin Gusler, NP, Ms Le[place]bury would benefit from an inpatient palliative referral.   [place] will continue to follow for any discharge planning needs and to coordinate continuation of palliative care.   If you have questions or need assistance, please call [phone]  or contact the hospital Liaison listed on AMION.     Thank you for the opportunity to participate in this patient's care.     Domenic Moras, BSN, RN [facility] Liaison   [phone]

## 2019-12-23 NOTE — Progress Notes (Signed)
PROGRESS NOTE    Kathryn Sloan  PNT:[id number] DOB: Jan 26, 1936 DOA: 12/22/2019 PCP: [facility]   Brief Narrative:  HPI: Kathryn Sloan is a 84 y.o. female with medical history significant for chronic systolic CHF, LBBB, NICM, s/p BiV ICD, T2DM, HTN, HLD, chronic anticoagulation with Coumadin (? Hx of A. fib), breast cancer s/p left breast lumpectomy, endometrial cancer s/p TAH/BSO, dementia, and recent pubic rami fracture who presents to the ED for evaluation of cough and right leg weakness.  History is limited from patient due to dementia and is therefore supplemented by EDP, chart review, and daughter by phone.  Patient was recently admitted to [facility] 12/19/2019-12/20/2018 for hypothesis in the setting of chronic Coumadin use and supratherapeutic INR >10.0. She was given IV vitamin K 2.5 mg with repeat INR improved to 2.0. She was discharged home with recommendation to hold further Coumadin until follow-up with PCP.  Daughter states that since her pelvic fracture she has had decreased mobility due to pain with ambulation and fear of falling.  She has been more bedbound with decreased oral intake.  Recently she has been coughing and daughter is concerned she may have aspirated some content.  She has had productive sputum with small spots of blood but not as significant as her most recent hospitalization.  Daughter also states that she is concerned about patient's multiple medications at home and which medications need to be continued and which can be discontinued.  Daughter states that patient has been on anticoagulation with Coumadin for a long period of time.  She is unaware of any history of irregular heart rhythm such as atrial fibrillation or atrial flutter, mechanical valve replacement, or history of blood clots.  ED Course:  Initial vitals showed BP 139/72, pulse 79, RR 18, temp 98.3 Fahrenheit, SPO2 94% on room air. Per EDP, SPO2 dropped to  87% on room air while at rest and patient was placed on 2 L of O2 via [place].  Patient has been tachypneic with RR up to 25.  Labs show INR 5.1, WBC 11.1, hemoglobin 11.2, platelets 295,000, sodium 137, potassium 4.1, bicarb 25, BUN 29, creatinine 0.96, serum glucose 170, AST 35, ALT 29, alk phos 465, total bilirubin 2.0, D-dimer 1.15.  SARS-CoV-2 PCR is ordered and pending.  2 view chest x-ray shows chronic elevation of the right hemidiaphragm with new small pleural effusions.  CTA chest PE study was negative for acute PE. Multifocal airspace consolidation was seen as well as trace bilateral pleural effusions.  Patient was given IV ceftriaxone and azithromycin. The hospitalist service was consulted to admit for further evaluation and management.  Assessment & Plan:   Principal Problem:   Acute respiratory failure with hypoxia (HCC) Active Problems:   Cough with hemoptysis   Chronic systolic CHF (congestive heart failure) (HCC)   Diabetes mellitus without complication (HCC)   Hyperlipidemia associated with type 2 diabetes mellitus (HCC)   Hypertension associated with diabetes ([place])   ICD (implantable cardioverter-defibrillator) in place   Closed fracture of multiple pubic rami (HCC)   Supratherapeutic INR   Acute respiratory failure with hypoxia due to aspiration pneumonia with hemoptysis: Covid and influenza negative.  CT chest shows multifocal pneumonia, no PE.  Patient feeling better and off of oxygen this morning.  SLP to see patient.  Continue Unasyn.  Hemoptysis resolved.  Was likely due to supratherapeutic INR.  Supratherapeutic INR: INR 6 today.  Received vitamin K yesterday.  Will likely see effect  tomorrow.  Hold Coumadin.  Watch closely.  Right inferior and superior pubic rami fractures: Seen on CT imaging 12/11/2019 after mechanical fall 3 days prior. -Continue pain control with Tylenol and Norco as needed with hold parameters -PT/OT eval  Chronic systolic  CHF/LBBB/NICM S/p BiV ICD: No recent echocardiogram on file.  Appears euvolemic on admission without acute issues.  Not requiring diuretics as an outpatient. -Continue Entresto, Coreg, spironolactone  T2DM: Takes Metformin and Lantus 20 units at home.  Currently on 10 units Lantus and SSI.  Blood sugar fairly controlled.  HTN: BP currently stable.  Continue home Entresto, Coreg, spironolactone.  Slightly tachycardic.  Will resume diltiazem as well.  HLD: Continue atorvastatin.  Dementia: Continue home Seroquel and Effexor.  Delirium precautions.  DVT prophylaxis: SCDs Start: 12/22/19 2328   Code Status: Full Code  Family Communication: None present at bedside.  Plan of care discussed with patient in length and he verbalized understanding and agreed with it.  Will call daughter later today.  Status is: Inpatient  Remains inpatient appropriate because:Inpatient level of care appropriate due to severity of illness   Dispo: The patient is from: Home              Anticipated d/c is to: Home              Anticipated d/c date is: 1 day              Patient currently is not medically stable to d/c.        Estimated body mass index is 29.56 kg/m as calculated from the following:   Height as of 12/20/19: 5' 2"  (1.575 m).   Weight as of 12/20/19: 73.3 kg.      Nutritional status:               Consultants:   None  Procedures:   None  Antimicrobials:  Anti-infectives (From admission, onward)   Start     Dose/Rate Route Frequency Ordered Stop   12/23/19 0000  ampicillin-sulbactam (UNASYN) 1.5 g in sodium chloride 0.9 % 100 mL IVPB        1.5 g 200 mL/hr over 30 Minutes Intravenous Every 6 hours 12/22/19 2350     12/22/19 2215  cefTRIAXone (ROCEPHIN) 1 g in sodium chloride 0.9 % 100 mL IVPB        1 g 200 mL/hr over 30 Minutes Intravenous  Once 12/22/19 2207 12/22/19 2316   12/22/19 2215  azithromycin (ZITHROMAX) 500 mg in sodium chloride 0.9 % 250 mL IVPB         500 mg 250 mL/hr over 60 Minutes Intravenous  Once 12/22/19 2207 12/23/19 0040         Subjective: Seen and met in the ED.  Sitting at the edge of the bed.  Not hypoxic, not on any oxygen.  Denied any shortness of breath or any other complaint.  Very pleasant.  Objective: Vitals:   12/23/19 0715 12/23/19 0730 12/23/19 0800 12/23/19 0930  BP:  133/90 (!) 113/100 109/65  Pulse: (!) 39 97 100 (!) 104  Resp:  (!) 28 (!) 28 (!) 23  Temp:      TempSrc:      SpO2: (!) 81% 94% 91% 95%    Intake/Output Summary (Last 24 hours) at 12/23/2019 1012 Last data filed at 12/23/2019 0130 Gross per 24 hour  Intake 450 ml  Output --  Net 450 ml   There were no vitals filed for this visit.  Examination:  General exam: Appears calm and comfortable  Respiratory system: Bibasilar rhonchi. Respiratory effort normal. Cardiovascular system: S1 & S2 heard, RRR. No JVD, murmurs, rubs, gallops or clicks. No pedal edema. Gastrointestinal system: Abdomen is nondistended, soft and nontender. No organomegaly or masses felt. Normal bowel sounds heard. Central nervous system: Alert and oriented. No focal neurological deficits. Extremities: Symmetric 5 x 5 power. Skin: No rashes, lesions or ulcers Psychiatry: Judgement and insight appear normal. Mood & affect appropriate.    Data Reviewed: I have personally reviewed following labs and imaging studies  CBC: Recent Labs  Lab 12/19/19 1235 12/20/19 1330 12/22/19 1806 12/23/19 0319  WBC 12.4* 13.9* 11.1* 11.9*  NEUTROABS 10.1*  --  8.1*  --   HGB 11.7* 11.1* 11.2* 10.3*  HCT 35.6* 34.8* 35.5* 32.6*  MCV 91.0 93.3 93.4 93.7  PLT 236 272 295 299   Basic Metabolic Panel: Recent Labs  Lab 12/19/19 1235 12/20/19 1330 12/22/19 1806 12/23/19 0319  NA 135 137 137 141  K 3.9 3.9 4.1 3.6  CL 104 105 104 108  CO2 22 22 25 23   GLUCOSE 185* 196* 170* 129*  BUN 36* 27* 29* 24*  CREATININE 1.04* 0.75 0.96 0.76  CALCIUM 9.0 9.1 9.4 9.1    GFR: Estimated Creatinine Clearance: 50.8 mL/min (by C-G formula based on SCr of 0.76 mg/dL). Liver Function Tests: Recent Labs  Lab 12/19/19 1235 12/22/19 1806  AST 23 35  ALT 23 29  ALKPHOS 361* 465*  BILITOT 1.7* 2.0*  PROT 6.6 6.4*  ALBUMIN 3.2* 2.5*   No results for input(s): LIPASE, AMYLASE in the last 168 hours. No results for input(s): AMMONIA in the last 168 hours. Coagulation Profile: Recent Labs  Lab 12/19/19 1235 12/20/19 1330 12/22/19 1806 12/23/19 0319  INR >10.0* 2.0* 5.1* 6.0*   Cardiac Enzymes: No results for input(s): CKTOTAL, CKMB, CKMBINDEX, TROPONINI in the last 168 hours. BNP (last 3 results) No results for input(s): PROBNP in the last 8760 hours. HbA1C: Recent Labs    12/23/19 0319  HGBA1C 6.3*   CBG: Recent Labs  Lab 12/23/19 0740  GLUCAP 144*   Lipid Profile: No results for input(s): CHOL, HDL, LDLCALC, TRIG, CHOLHDL, LDLDIRECT in the last 72 hours. Thyroid Function Tests: No results for input(s): TSH, T4TOTAL, FREET4, T3FREE, THYROIDAB in the last 72 hours. Anemia Panel: No results for input(s): VITAMINB12, FOLATE, FERRITIN, TIBC, IRON, RETICCTPCT in the last 72 hours. Sepsis Labs: No results for input(s): PROCALCITON, LATICACIDVEN in the last 168 hours.  Recent Results (from the past 240 hour(s))  Respiratory Panel by RT PCR (Flu A&B, Covid) - Nasopharyngeal Swab     Status: None   Collection Time: 12/19/19 12:29 PM   Specimen: Nasopharyngeal Swab  Result Value Ref Range Status   SARS Coronavirus 2 by RT PCR NEGATIVE NEGATIVE Final    Comment: (NOTE) SARS-CoV-2 target nucleic acids are NOT DETECTED.  The SARS-CoV-2 RNA is generally detectable in upper respiratoy specimens during the acute phase of infection. The lowest concentration of SARS-CoV-2 viral copies this assay can detect is 131 copies/mL. A negative result does not preclude SARS-Cov-2 infection and should not be used as the sole basis for treatment or other  patient management decisions. A negative result may occur with  improper specimen collection/handling, submission of specimen other than nasopharyngeal swab, presence of viral mutation(s) within the areas targeted by this assay, and inadequate number of viral copies (<131 copies/mL). A negative result must be combined with clinical observations, patient  history, and epidemiological information. The expected result is Negative.  Fact Sheet for Patients:  PinkCheek.be  Fact Sheet for Healthcare Providers:  GravelBags.it  This test is no t yet approved or cleared by the [place] FDA and  has been authorized for detection and/or diagnosis of SARS-CoV-2 by FDA under an Emergency Use Authorization (EUA). This EUA will remain  in effect (meaning this test can be used) for the duration of the COVID-19 declaration under Section 564(b)(1) of the Act, 21 U.S.C. section 360bbb-3(b)(1), unless the authorization is terminated or revoked sooner.     Influenza A by PCR NEGATIVE NEGATIVE Final   Influenza B by PCR NEGATIVE NEGATIVE Final    Comment: (NOTE) The Xpert Xpress SARS-CoV-2/FLU/RSV assay is intended as an aid in  the diagnosis of influenza from Nasopharyngeal swab specimens and  should not be used as a sole basis for treatment. Nasal washings and  aspirates are unacceptable for Xpert Xpress SARS-CoV-2/FLU/RSV  testing.  Fact Sheet for Patients: PinkCheek.be  Fact Sheet for Healthcare Providers: GravelBags.it  This test is not yet approved or cleared by the [place] FDA and  has been authorized for detection and/or diagnosis of SARS-CoV-2 by  FDA under an Emergency Use Authorization (EUA). This EUA will remain  in effect (meaning this test can be used) for the duration of the  Covid-19 declaration under Section 564(b)(1) of the Act, 21  U.S.C. section  360bbb-3(b)(1), unless the authorization is  terminated or revoked. Performed at [facility], [address] [place], [place] [address]   Respiratory Panel by RT PCR (Flu A&B, Covid) - Nasopharyngeal Swab     Status: None   Collection Time: 12/22/19 10:42 PM   Specimen: Nasopharyngeal Swab  Result Value Ref Range Status   SARS Coronavirus 2 by RT PCR NEGATIVE NEGATIVE Final    Comment: (NOTE) SARS-CoV-2 target nucleic acids are NOT DETECTED.  The SARS-CoV-2 RNA is generally detectable in upper respiratoy specimens during the acute phase of infection. The lowest concentration of SARS-CoV-2 viral copies this assay can detect is 131 copies/mL. A negative result does not preclude SARS-Cov-2 infection and should not be used as the sole basis for treatment or other patient management decisions. A negative result may occur with  improper specimen collection/handling, submission of specimen other than nasopharyngeal swab, presence of viral mutation(s) within the areas targeted by this assay, and inadequate number of viral copies (<131 copies/mL). A negative result must be combined with clinical observations, patient history, and epidemiological information. The expected result is Negative.  Fact Sheet for Patients:  PinkCheek.be  Fact Sheet for Healthcare Providers:  GravelBags.it  This test is no t yet approved or cleared by the [place] FDA and  has been authorized for detection and/or diagnosis of SARS-CoV-2 by FDA under an Emergency Use Authorization (EUA). This EUA will remain  in effect (meaning this test can be used) for the duration of the COVID-19 declaration under Section 564(b)(1) of the Act, 21 U.S.C. section 360bbb-3(b)(1), unless the authorization is terminated or revoked sooner.     Influenza A by PCR NEGATIVE NEGATIVE Final   Influenza B by PCR NEGATIVE NEGATIVE Final    Comment: (NOTE) The Xpert Xpress  SARS-CoV-2/FLU/RSV assay is intended as an aid in  the diagnosis of influenza from Nasopharyngeal swab specimens and  should not be used as a sole basis for treatment. Nasal washings and  aspirates are unacceptable for Xpert Xpress SARS-CoV-2/FLU/RSV  testing.  Fact Sheet for Patients: PinkCheek.be  Fact Sheet for Healthcare Providers: GravelBags.it  This test is not yet approved or cleared by the [place] FDA and  has been authorized for detection and/or diagnosis of SARS-CoV-2 by  FDA under an Emergency Use Authorization (EUA). This EUA will remain  in effect (meaning this test can be used) for the duration of the  Covid-19 declaration under Section 564(b)(1) of the Act, 21  U.S.C. section 360bbb-3(b)(1), unless the authorization is  terminated or revoked. Performed at [facility], [place] [address] [place], [place] [address]       Radiology Studies: DG Chest 2 View  Result Date: 12/22/2019 CLINICAL DATA:  84 year old female with cough. Inferior pubic ramus fracture. Right leg pain. EXAM: CHEST - 2 VIEW COMPARISON:  Portable chest 12/19/2019. report of rib series 07/27/2013. FINDINGS: Upright AP and lateral views of the chest. Moderate elevation of the right hemidiaphragm is stable, and also was described in 2015. Associated right lung base atelectasis. No superimposed pneumothorax, pulmonary edema, definite consolidation. However, best seen on the lateral view is density tracking along the major fissure, probably related to small pleural effusion. Stable left chest cardiac AICD. Stable cardiac size and mediastinal contours. Visualized tracheal air column is within normal limits. Calcified aortic atherosclerosis. Bulky endplate spurring in the spine. Negative visible bowel gas pattern. IMPRESSION: 1. 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 2. New small pleural effusions suspected on the right, and tracking into the major fissure. Electronically Signed   By: Genevie Ann M.D.   On: 12/22/2019 18:58   CT Angio Chest PE W and/or Wo Contrast  Result Date: 12/22/2019 CLINICAL DATA:  PE suspected.  High pretest probability. EXAM: CT ANGIOGRAPHY CHEST WITH CONTRAST TECHNIQUE: Multidetector CT imaging of the chest was performed using the standard protocol during bolus administration of intravenous contrast. Multiplanar CT image reconstructions and MIPs were obtained to evaluate the vascular anatomy. CONTRAST:  51m OMNIPAQUE IOHEXOL 350 MG/ML SOLN COMPARISON:  None. FINDINGS: Cardiovascular: Contrast injection is sufficient to demonstrate satisfactory opacification of the pulmonary arteries to the segmental level. There is no pulmonary embolus or evidence of right heart strain. The size of the main pulmonary artery is normal. Cardiomegaly with coronary artery calcification. The course and caliber of the aorta are normal. There is mild atherosclerotic calcification. Opacification decreased due to pulmonary arterial phase contrast bolus timing. Mediastinum/Nodes: -- No mediastinal lymphadenopathy. -- No hilar lymphadenopathy. -- No axillary lymphadenopathy. -- No supraclavicular lymphadenopathy. -- Normal thyroid gland where visualized. -  Unremarkable esophagus. Lungs/Pleura: There is significant elevation of the right hemidiaphragm. Ther there is significant volume loss involving the right lower lobe. Bibasilar airspace consolidation is noted. There is consolidation in the posterior right upper lobe. There is no pneumothorax. The trachea is unremarkable. There are trace bilateral pleural effusions. Upper Abdomen: Contrast bolus timing is not optimized for evaluation of the abdominal organs. The visualized portions of the organs of the upper abdomen are normal. Musculoskeletal: No chest wall abnormality. No bony spinal canal stenosis. Review of  the MIP images confirms the above findings. IMPRESSION: 1. No acute pulmonary embolism. 2. Multifocal airspace consolidation, concerning for multifocal pneumonia or aspiration. 3. Trace bilateral pleural effusions. 4. Cardiomegaly with coronary artery disease. Aortic Atherosclerosis (ICD10-I70.0). Electronically Signed   By: CConstance HolsterM.D.   On: 12/22/2019 21:50    Scheduled Meds: . atorvastatin  40 mg Oral q1800  . carvedilol  25 mg Oral BID  WC  . insulin aspart  0-9 Units Subcutaneous TID WC  . insulin glargine  10 Units Subcutaneous QHS  . QUEtiapine  25 mg Oral QHS  . sacubitril-valsartan  1 tablet Oral QHS  . spironolactone  25 mg Oral BID  . venlafaxine XR  75 mg Oral Q breakfast   Continuous Infusions: . ampicillin-sulbactam (UNASYN) IV 1.5 g (12/23/19 0610)     LOS: 1 day   Time spent: 35 minutes   Darliss Cheney, MD [facility]  12/23/2019, 10:12 AM   To contact the attending provider between 7A-7P or the covering provider during after hours 7P-7A, please log into the web site [email or web address].

## 2019-12-23 NOTE — ED Notes (Signed)
Date and time results received: 12/23/19 0627   Test: INR Critical Value: 6.0  Name of Provider Notified: Darliss Cheney MD  Orders Received? Or Actions Taken?: awaiting for further orders.

## 2019-12-23 NOTE — Evaluation (Signed)
Physical Therapy Evaluation Patient Details Name: Kathryn Sloan MRN: [id number] DOB: 1935-10-05 Today's Date: 12/23/2019   History of Present Illness  Pt is 84 yo female with hx of CHF, LBBB, NICM, s/p BiV ICD, DM2, HTN, HLD, breast CA, endometrial CA, dementia, and recent pubic rami fracture who presented to ED with cough and R leg weakness. Pt admitted with acute resp failure with hypoxia.  Additionally, pt with supratherapeutic INR.  Clinical Impression  Pt admitted with above diagnosis. Did note pt with INR of 6; however, this has been a chronic issue for her - saw PT/OT together for safety.  She presents with decreased mobiltiy, strength, endurance, balance, and safety.  All VSS during therapy.  Pt resides with daughter with close 24 hr supervision and significant DME.  With pt's hx of dementia and able to transfer/ambulate with min A recommend home with [facility] services. Pt currently with functional limitations due to the deficits listed below (see PT Problem List). Pt will benefit from skilled PT to increase their independence and safety with mobility to allow discharge to the venue listed below.       Follow Up Recommendations Home health PT;Supervision/Assistance - 24 hour    Equipment Recommendations  None recommended by PT (Has all DME)    Recommendations for Other Services       Precautions / Restrictions Precautions Precautions: Fall Precaution Comments: Hx elevated INR Restrictions Weight Bearing Restrictions: No Other Position/Activity Restrictions: recent pelvic fracture (10/27)      Mobility  Bed Mobility Overal bed mobility: Needs Assistance Bed Mobility: Supine to Sit;Sit to Supine     Supine to sit: Min assist Sit to supine: Min assist   General bed mobility comments: Min A for trunk up and legs back to bed; instructed daughter in methods to assist including draw sheet and keeping legs together for decreased pain with pelvic fx    Transfers Overall transfer  level: Needs assistance Equipment used: 2 person hand held assist Transfers: Sit to/from Stand Sit to Stand: Min assist;+2 physical assistance;+2 safety/equipment         General transfer comment: assist to rise and steady  Ambulation/Gait Ambulation/Gait assistance: Min assist;+2 physical assistance Gait Distance (Feet): 25 Feet Assistive device: 2 person hand held assist Gait Pattern/deviations: Step-to pattern;Decreased stance time - right;Shuffle Gait velocity: decreased   General Gait Details: No RW readily available in ED; performed with HHA of 2; limited distance due to R LE pain  Stairs            Wheelchair Mobility    Modified Rankin (Stroke Patients Only)       Balance Overall balance assessment: Needs assistance Sitting-balance support: Feet supported;No upper extremity supported Sitting balance-Leahy Scale: Good     Standing balance support: Bilateral upper extremity supported;During functional activity Standing balance-Leahy Scale: Poor Standing balance comment: reliant on external support                             Pertinent Vitals/Pain Pain Assessment: Faces Faces Pain Scale: Hurts little more Pain Location: RLE with weight bearing Pain Descriptors / Indicators: Aching;Sore Pain Intervention(s): Limited activity within patient's tolerance;Monitored during session;Repositioned    Home Living Family/patient expects to be discharged to:: Private residence Living Arrangements: Children Available Help at Discharge: Family;Available 24 hours/day Daughter reports even sleeps in same bed.  Type of Home: House Home Access: Level entry     Home Layout: One level Home Equipment:  Walker - 2 wheels;Walker - 4 wheels;Cane - quad;Cane - single point;Bedside commode;Transport chair Additional Comments: also has hoyer    Prior Function Level of Independence: Needs assistance   Gait / Transfers Assistance Needed: Reports typically very  active and can ambulate in community.  Since pelvic fracture at the end of October, she has not ambulated much  ADL's / Homemaking Assistance Needed: assist for bathing and IADL management from daughter  Comments: loves to go to thrift shops     Hand Dominance        Extremity/Trunk Assessment   Upper Extremity Assessment Upper Extremity Assessment: Defer to OT evaluation    Lower Extremity Assessment Lower Extremity Assessment: RLE deficits/detail;LLE deficits/detail RLE Deficits / Details: ROM WFL; Demonstrating at least 3/5 strength knee and ankle and 2/5 hip but not further tested due to pain LLE Deficits / Details: ROM WFL; Demonstrating at least 3/5 strength but not further tested due to pain    Cervical / Trunk Assessment Cervical / Trunk Assessment: Normal  Communication   Communication: No difficulties  Cognition Arousal/Alertness: Awake/alert Behavior During Therapy: WFL for tasks assessed/performed Overall Cognitive Status: History of cognitive impairments - at baseline Area of Impairment: Orientation;Memory;Safety/judgement;Problem solving                 Orientation Level: Disoriented to;Place;Time;Situation   Memory: Decreased short-term memory   Safety/Judgement: Decreased awareness of safety;Decreased awareness of deficits   Problem Solving: Slow processing;Difficulty sequencing;Requires tactile cues;Requires verbal cues General Comments: pt with history of dementia at baseline. Daughter reports she always has a loose interpretation of time. Requires increased cues and processing time for basic ADL tasks      General Comments General comments (skin integrity, edema, etc.): Educated pt and daughter on PT role and POC.  Discussed SNF vs home with 24 hr support and HHPT/OT.  Daughter prefers [facility] as she already provides 24 hr support and feels that pt would do better at home due to dementia - PT agrees.  Educated daughter on safe transfer and gait  techniques.  Provided gait belt.    Exercises     Assessment/Plan    PT Assessment Patient needs continued PT services  PT Problem List Decreased strength;Decreased mobility;Decreased safety awareness;Decreased coordination;Decreased knowledge of precautions;Decreased activity tolerance;Decreased cognition;Decreased balance;Decreased knowledge of use of DME;Pain       PT Treatment Interventions DME instruction;Therapeutic exercise;Gait training;Balance training;Stair training;Functional mobility training;Cognitive remediation;Therapeutic activities;Patient/family education    PT Goals (Current goals can be found in the Care Plan section)  Acute Rehab PT Goals Patient Stated Goal: return home per daughter PT Goal Formulation: With patient/family Time For Goal Achievement: 01/06/20 Potential to Achieve Goals: Good    Frequency Min 3X/week   Barriers to discharge        Co-evaluation PT/OT/SLP Co-Evaluation/Treatment: Yes Reason for Co-Treatment: For patient/therapist safety PT goals addressed during session: Mobility/safety with mobility OT goals addressed during session: ADL's and self-care;Strengthening/ROM       AM-PAC PT "6 Clicks" Mobility  Outcome Measure Help needed turning from your back to your side while in a flat bed without using bedrails?: A Little Help needed moving from lying on your back to sitting on the side of a flat bed without using bedrails?: A Little Help needed moving to and from a bed to a chair (including a wheelchair)?: A Little Help needed standing up from a chair using your arms (e.g., wheelchair or bedside chair)?: A Little Help needed to walk in hospital room?: A  Little Help needed climbing 3-5 steps with a railing? : A Lot 6 Click Score: 17    End of Session Equipment Utilized During Treatment: Gait belt Activity Tolerance: Patient tolerated treatment well Patient left: in bed;with call bell/phone within reach;with family/visitor  present Nurse Communication: Mobility status PT Visit Diagnosis: Unsteadiness on feet (R26.81);Pain;Muscle weakness (generalized) (M62.81) Pain - Right/Left: Right Pain - part of body: Hip;Leg    Time: 0905-0256 PT Time Calculation (min) (ACUTE ONLY): 31 min   Charges:   PT Evaluation $PT Eval Low Complexity: 1 Low          Shigeko Manard, PT Acute Rehab Services Pager [phone] [facility] [phone]    Karlton Lemon 12/23/2019, 1:45 PM

## 2019-12-23 NOTE — ED Notes (Signed)
Report to Liz RN.

## 2019-12-23 NOTE — ED Notes (Signed)
Pt resting in bed. NADN 

## 2019-12-24 ENCOUNTER — Other Ambulatory Visit: Payer: Medicare Other | Admitting: Nurse Practitioner

## 2019-12-24 DIAGNOSIS — J9601 Acute respiratory failure with hypoxia: Secondary | ICD-10-CM | POA: Diagnosis not present

## 2019-12-24 LAB — COMPREHENSIVE METABOLIC PANEL
ALT: 28 U/L (ref 0–44)
AST: 25 U/L (ref 15–41)
Albumin: 2.3 g/dL — ABNORMAL LOW (ref 3.5–5.0)
Alkaline Phosphatase: 417 U/L — ABNORMAL HIGH (ref 38–126)
Anion gap: 10 (ref 5–15)
BUN: 27 mg/dL — ABNORMAL HIGH (ref 8–23)
CO2: 22 mmol/L (ref 22–32)
Calcium: 8.8 mg/dL — ABNORMAL LOW (ref 8.9–10.3)
Chloride: 107 mmol/L (ref 98–111)
Creatinine, Ser: 1.25 mg/dL — ABNORMAL HIGH (ref 0.44–1.00)
GFR, Estimated: 43 mL/min — ABNORMAL LOW (ref >60)
Glucose, Bld: 104 mg/dL — ABNORMAL HIGH (ref 70–99)
Potassium: 3.7 mmol/L (ref 3.5–5.1)
Sodium: 139 mmol/L (ref 135–145)
Total Bilirubin: 1.4 mg/dL — ABNORMAL HIGH (ref 0.3–1.2)
Total Protein: 5.8 g/dL — ABNORMAL LOW (ref 6.5–8.1)

## 2019-12-24 LAB — CBC WITH DIFFERENTIAL/PLATELET
Abs Immature Granulocytes: 0.15 10*3/uL — ABNORMAL HIGH (ref 0.00–0.07)
Basophils Absolute: 0.1 10*3/uL (ref 0.0–0.1)
Basophils Relative: 0 %
Eosinophils Absolute: 0.2 10*3/uL (ref 0.0–0.5)
Eosinophils Relative: 2 %
HCT: 29.6 % — ABNORMAL LOW (ref 36.0–46.0)
Hemoglobin: 9.5 g/dL — ABNORMAL LOW (ref 12.0–15.0)
Immature Granulocytes: 1 %
Lymphocytes Relative: 19 %
Lymphs Abs: 2.3 10*3/uL (ref 0.7–4.0)
MCH: 29.7 pg (ref 26.0–34.0)
MCHC: 32.1 g/dL (ref 30.0–36.0)
MCV: 92.5 fL (ref 80.0–100.0)
Monocytes Absolute: 0.9 10*3/uL (ref 0.1–1.0)
Monocytes Relative: 8 %
Neutro Abs: 8.4 10*3/uL — ABNORMAL HIGH (ref 1.7–7.7)
Neutrophils Relative %: 70 %
Platelets: 272 10*3/uL (ref 150–400)
RBC: 3.2 MIL/uL — ABNORMAL LOW (ref 3.87–5.11)
RDW: 14.6 % (ref 11.5–15.5)
WBC: 12 10*3/uL — ABNORMAL HIGH (ref 4.0–10.5)
nRBC: 0 % (ref 0.0–0.2)

## 2019-12-24 LAB — GLUCOSE, CAPILLARY
Glucose-Capillary: 104 mg/dL — ABNORMAL HIGH (ref 70–99)
Glucose-Capillary: 137 mg/dL — ABNORMAL HIGH (ref 70–99)

## 2019-12-24 LAB — PROTIME-INR
INR: 2.1 — ABNORMAL HIGH (ref 0.8–1.2)
Prothrombin Time: 22.5 seconds — ABNORMAL HIGH (ref 11.4–15.2)

## 2019-12-24 MED ORDER — ASPIRIN EC 81 MG PO TBEC: 81.0000 mg | DELAYED_RELEASE_TABLET | Freq: Every day | ORAL | 0 refills | Status: AC

## 2019-12-24 MED ORDER — CEFDINIR 300 MG PO CAPS: 300.0000 mg | ORAL_CAPSULE | Freq: Two times a day (BID) | ORAL | 0 refills | Status: AC

## 2019-12-24 NOTE — Discharge Summary (Signed)
Physician Discharge Summary  Kathryn Sloan OBS:[id number] DOB: 1935-05-10 DOA: 12/22/2019  PCP: [facility] date: 12/22/2019 Discharge date: 12/24/2019  Admitted From: Home Disposition: Home  Recommendations for Outpatient Follow-up:  1. Follow up with PCP in 1-2 weeks 2. Follow-up with primary cardiologist to discuss further about Coumadin. 3. Please obtain BMP/CBC in one week 4. Please follow up with your PCP on the following pending results: Unresulted Labs (From admission, onward)         None       Home Health: None Equipment/Devices: Home oxygen  Discharge Condition: Stable CODE STATUS: Full code Diet recommendation: Cardiac  Subjective: Seen and examined.  She has no complaint.  Brief/Interim Summary: [name] a 84 y.o.femalewith medical history significant forchronic systolic CHF, LBBB,NICM,s/pBiVICD, T2DM, HTN, HLD, chronic anticoagulation with Coumadin (indication unknown), breast cancer s/p left breast lumpectomy, endometrial cancer s/p TAH/BSO, dementia, and recent pubic rami fracture who presented to the ED for evaluation ofcoughand right leg weakness.  Patient was recently admitted to [facility] 12/19/2019-12/20/2018 for hypothesis in the setting of chronic Coumadin use and supratherapeutic INR>10.0. She was given IV vitamin K 2.5 mg with repeat INR improved to 2.0. She was discharged home with recommendation to hold further Coumadin until follow-up with PCP.  Per daughter, recently she has been coughing and daughter is concerned she may have aspirated some content. She has had productive sputum with small spots of blood but not as significant as her most recent hospitalization. Daughter also states that she is concerned about patient's multiple medications at home and which medications need to be continued and which can be discontinued. Daughter states that patient has been on anticoagulation with  Coumadin for a long period of time. She is unaware of any history of irregular heart rhythm such as atrial fibrillation or atrial flutter, mechanical valve replacement, or history of blood clots.  ED Course: Initial vitals showed BP 139/72, pulse 79, RR 18, temp 98.3 Fahrenheit, SPO2 94% on room air. Per EDP, SPO2 dropped to 87% on room air while at rest and patient was placed on 2 L of O2 via .Patient has been tachypneic with RR up to 25.  Labs show INR 5.1, WBC 11.1, hemoglobin 11.2, platelets 295,000, sodium 137, potassium 4.1, bicarb 25, BUN 29, creatinine 0.96, serum glucose 170, AST 35, ALT 29, alk phos 465, total bilirubin 2.0, D-dimer 1.15.  SARS-CoV-2 PCR is ordered and pending.  2 view chest x-ray shows chronic elevation of the right hemidiaphragm with new small pleural effusions.  CTA chest PE study was negative for acute PE. Multifocal airspace consolidation was seen as well as trace bilateral pleural effusions.  Patient was given IV ceftriaxone and azithromycin. The hospitalist service was consulted toadmitfor further evaluation and management.  Patient was admitted to hospital service with acute hypoxic respiratory failure secondary to community-acquired pneumonia and she was started on Rocephin and Zithromax which was continued.  She was soon weaned down to room air.  Patient never had any complaint of shortness of breath but did have intermittent hemoptysis.  Her INR was once again 6 upon presentation, for which she was given vitamin K 2.5 mg and currently her INR is 2.1.  Daughter related her concerns about multiple medications.  I had a long discussion with the daughter.  Patient was recently started on Seroquel and she also takes Benadryl at home.  I strongly recommended against using Benadryl for sleep.  Daughter not aware why  patient is on Coumadin but she tells me that patient has tendency to easily bruise with minor trauma.  Patient also has scattered bruises in  upper and lower extremities.  After long discussion, we (patient's daughter and I) mutually agreed upon discontinuing Coumadin at this point in time since there is no clear indication and I recommended her to bring the patient to primary cardiologist who initially started Coumadin to further discuss about it.  In the meantime, I am starting her on aspirin 81 mg.  Patient is requiring 1 to 2 L of oxygen at this point in time.  She is going to be discharged on home oxygen which I believe will be temporary.  She is also being discharged on 5 days of oral cefdinir.  Discharge Diagnoses:  Principal Problem:   Acute respiratory failure with hypoxia (HCC) Active Problems:   Cough with hemoptysis   Chronic systolic CHF (congestive heart failure) (HCC)   Diabetes mellitus without complication ([place])   Hyperlipidemia associated with type 2 diabetes mellitus ([place])   Hypertension associated with diabetes ([place])   ICD (implantable cardioverter-defibrillator) in place   Closed fracture of multiple pubic rami (HCC)   Supratherapeutic INR    Discharge Instructions   Allergies as of 12/24/2019   No Known Allergies     Medication List    STOP taking these medications   diphenhydrAMINE 25 MG tablet Commonly known as: BENADRYL     TAKE these medications   aspirin EC 81 MG tablet Take 1 tablet (81 mg total) by mouth daily. Swallow whole.   atorvastatin 40 MG tablet Commonly known as: LIPITOR Take 40 mg by mouth daily.   carvedilol 25 MG tablet Commonly known as: COREG Take 25 mg by mouth 2 (two) times daily with a meal.   cefdinir 300 MG capsule Commonly known as: OMNICEF Take 1 capsule (300 mg total) by mouth 2 (two) times daily for 5 days.   diltiazem 60 MG 12 hr capsule Commonly known as: CARDIZEM SR Take 60 mg by mouth at bedtime.   Entresto 49-51 MG Generic drug: sacubitril-valsartan Take 1 tablet by mouth at bedtime.   fexofenadine 180 MG tablet Commonly known as: ALLEGRA Take  180 mg by mouth daily.   HYDROcodone-acetaminophen 5-325 MG tablet Commonly known as: NORCO/VICODIN Take 1 tablet by mouth every 6 (six) hours as needed for severe pain.   insulin glargine 100 UNIT/ML injection Commonly known as: LANTUS Inject 20 Units into the skin at bedtime.   metFORMIN 500 MG 24 hr tablet Commonly known as: GLUCOPHAGE-XR Take 500 mg by mouth in the morning and at bedtime.   mometasone 50 MCG/ACT nasal spray Commonly known as: NASONEX Place 2 sprays into the nose daily as needed (allergies).   montelukast 10 MG tablet Commonly known as: SINGULAIR Take 10 mg by mouth at bedtime.   ondansetron 4 MG tablet Commonly known as: ZOFRAN Take 4 mg by mouth every 8 (eight) hours as needed.   pseudoephedrine 120 MG 12 hr tablet Commonly known as: SUDAFED Take 120 mg by mouth 2 (two) times daily.   QUEtiapine 25 MG tablet Commonly known as: SEROQUEL Take 25 mg by mouth at bedtime.   spironolactone 25 MG tablet Commonly known as: ALDACTONE Take 25 mg by mouth 2 (two) times daily.   venlafaxine XR 75 MG 24 hr capsule Commonly known as: EFFEXOR-XR Take 75 mg by mouth daily with breakfast.   vitamin C 1000 MG tablet Take 1,000 mg by mouth daily.  Durable Medical Equipment  (From admission, onward)         Start     Ordered   12/24/19 1224  For home use only DME oxygen  Once       Question Answer Comment  Length of Need Lifetime   Liters per Minute 2   Frequency Continuous (stationary and portable oxygen unit needed)   Oxygen delivery system Gas      12/24/19 1223          Follow-up Information    [facility] up in 1 week(s).   Contact information: [address] [place] [address] [phone]              No Known Allergies  Consultations: None   Procedures/Studies: DG Chest 2 View  Result Date: 12/22/2019 CLINICAL DATA:  84 year old female with cough. Inferior pubic ramus  fracture. Right leg pain. EXAM: CHEST - 2 VIEW COMPARISON:  Portable chest 12/19/2019. report of rib series 07/27/2013. FINDINGS: Upright AP and lateral views of the chest. Moderate elevation of the right hemidiaphragm is stable, and also was described in 2015. Associated right lung base atelectasis. No superimposed pneumothorax, pulmonary edema, definite consolidation. However, best seen on the lateral view is density tracking along the major fissure, probably related to small pleural effusion. Stable left chest cardiac AICD. Stable cardiac size and mediastinal contours. Visualized tracheal air column is within normal limits. Calcified aortic atherosclerosis. Bulky endplate spurring in the spine. Negative visible bowel gas pattern. IMPRESSION: 1. 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 2. New small pleural effusions suspected on the right, and tracking into the major fissure. Electronically Signed   By: Genevie Ann M.D.   On: 12/22/2019 18:58   CT Angio Chest PE W and/or Wo Contrast  Result Date: 12/22/2019 CLINICAL DATA:  PE suspected.  High pretest probability. EXAM: CT ANGIOGRAPHY CHEST WITH CONTRAST TECHNIQUE: Multidetector CT imaging of the chest was performed using the standard protocol during bolus administration of intravenous contrast. Multiplanar CT image reconstructions and MIPs were obtained to evaluate the vascular anatomy. CONTRAST:  5m OMNIPAQUE IOHEXOL 350 MG/ML SOLN COMPARISON:  None. FINDINGS: Cardiovascular: Contrast injection is sufficient to demonstrate satisfactory opacification of the pulmonary arteries to the segmental level. There is no pulmonary embolus or evidence of right heart strain. The size of the main pulmonary artery is normal. Cardiomegaly with coronary artery calcification. The course and caliber of the aorta are normal. There is mild atherosclerotic calcification. Opacification  decreased due to pulmonary arterial phase contrast bolus timing. Mediastinum/Nodes: -- No mediastinal lymphadenopathy. -- No hilar lymphadenopathy. -- No axillary lymphadenopathy. -- No supraclavicular lymphadenopathy. -- Normal thyroid gland where visualized. -  Unremarkable esophagus. Lungs/Pleura: There is significant elevation of the right hemidiaphragm. Ther there is significant volume loss involving the right lower lobe. Bibasilar airspace consolidation is noted. There is consolidation in the posterior right upper lobe. There is no pneumothorax. The trachea is unremarkable. There are trace bilateral pleural effusions. Upper Abdomen: Contrast bolus timing is not optimized for evaluation of the abdominal organs. The visualized portions of the organs of the upper abdomen are normal. Musculoskeletal: No chest wall abnormality. No bony spinal canal stenosis. Review of the MIP images confirms the above findings. IMPRESSION: 1. No acute pulmonary embolism. 2. Multifocal airspace consolidation, concerning for multifocal pneumonia or aspiration. 3. Trace bilateral pleural effusions. 4. Cardiomegaly with coronary artery disease. Aortic Atherosclerosis (ICD10-I70.0).  Electronically Signed   By: Constance Holster M.D.   On: 12/22/2019 21:50   CT Lumbar Spine Wo Contrast  Result Date: 12/11/2019 CLINICAL DATA:  Back pain.  Trauma 3 weeks ago. EXAM: CT LUMBAR SPINE WITHOUT CONTRAST TECHNIQUE: Multidetector CT imaging of the lumbar spine was performed without intravenous contrast administration. Multiplanar CT image reconstructions were also generated. COMPARISON:  None. FINDINGS: Segmentation: There are five lumbar type vertebral bodies. The last full intervertebral disc space is labeled L5-S1. Alignment: Grade 1 spondylolisthesis at L5 due to bilateral pars defects. There is also degenerative anterolisthesis of L4. Mild posterior listhesis of L1. Vertebrae: Age related osteoporosis but no acute fracture or worrisome  bone lesions. Paraspinal and other soft tissues: Advanced aortic calcifications but no aneurysm. No retroperitoneal mass or adenopathy. Disc levels: T12-L1: No significant findings. L1-2: Bulging annulus and moderate facet disease but no significant spinal or foraminal stenosis. L2-3: Bulging annulus and advanced facet disease contributing to mild spinal and bilateral lateral recess stenosis. No significant foraminal stenosis. L3-4: Right-sided laminectomy defect is noted. Diffuse bulging annulus and advanced facet disease but no significant spinal or foraminal stenosis. L4-5: Advanced disc disease and facet disease without significant spinal or foraminal stenosis. L5-S1: Bulging uncovered disc and severe facet disease. Mild spinal, lateral recess and mild to moderate foraminal stenosis bilaterally. IMPRESSION: 1. Grade 1 spondylolisthesis at L5 due to bilateral pars defects. There is also degenerative anterolisthesis of L4. 2. Mild spinal, lateral recess and mild to moderate foraminal stenosis bilaterally at L5-S1. 3. Mild spinal and bilateral lateral recess stenosis at L2-3. 4. Right-sided laminectomy defect at L3-4. 5. Aortic atherosclerosis. Aortic Atherosclerosis (ICD10-I70.0). Electronically Signed   By: Marijo Sanes M.D.   On: 12/11/2019 15:54   CT Hip Right Wo Contrast  Result Date: 12/11/2019 CLINICAL DATA:  Right hip pain for 3 weeks.  History of trauma. EXAM: CT OF THE RIGHT HIP WITHOUT CONTRAST TECHNIQUE: Multidetector CT imaging of the right hip was performed according to the standard protocol. Multiplanar CT image reconstructions were also generated. COMPARISON:  None. FINDINGS: The right hip is normally located. No hip fracture or evidence of AVN. Mild to moderate hip joint degenerative changes. There are nondisplaced fractures involving the superior and inferior pubic rami. Moderate degenerative changes at the pubic symphysis. Enthesophytes noted at the ischial tuberosity and greater  trochanter. The hip and pelvic musculature is grossly normal. No obvious muscle tear or intramuscular hematoma. Age related atherosclerotic calcifications are noted. No significant intrapelvic abnormalities are identified. IMPRESSION: 1. Nondisplaced fractures involving the superior and inferior pubic rami. 2. No hip fracture or evidence of AVN. 3. Mild to moderate hip joint degenerative changes. Electronically Signed   By: Marijo Sanes M.D.   On: 12/11/2019 15:48   DG Chest Portable 1 View  Result Date: 12/19/2019 CLINICAL DATA:  Began spitting up blood today, diabetes mellitus EXAM: PORTABLE CHEST 1 VIEW COMPARISON:  Portable exam 1305 hours without priors for comparison FINDINGS: LEFT subclavian AICD with leads projecting over RIGHT atrium, RIGHT ventricle, and coronary sinus. Upper normal heart size. Mediastinal contours and pulmonary vascularity normal. Atherosclerotic calcification aorta. Marked elevation of RIGHT diaphragm with RIGHT basilar atelectasis. Peribronchial thickening with minimal LEFT basilar atelectasis. No acute infiltrate, pleural effusion, or pneumothorax. Bones demineralized. IMPRESSION: Bronchitic changes with bibasilar atelectasis greater on RIGHT. Electronically Signed   By: Lavonia Dana M.D.   On: 12/19/2019 13:57   DG Hip Unilat W or Wo Pelvis 2-3 Views Left  Result Date: 12/19/2019 CLINICAL DATA:  BILATERAL hip pain, known RIGHT pubic ramus fractures 10 days ago, question new injury EXAM: DG HIP (WITH OR WITHOUT PELVIS) 2-3V LEFT COMPARISON:  None FINDINGS: Osseous demineralization. LEFT hip and SI joint spaces preserved. Fractures of RIGHT superior inferior pubic rami identified at margin of exam. No acute fracture, dislocation, or bone destruction. IMPRESSION: No acute osseous abnormalities. Electronically Signed   By: Lavonia Dana M.D.   On: 12/19/2019 13:59   DG Hip Unilat W or Wo Pelvis 2-3 Views Right  Result Date: 12/19/2019 CLINICAL DATA:  Known RIGHT pubic ramus  fracture 10 days ago, BILATERAL hip pain, screening for new injury, began spitting up blood today, diabetes mellitus EXAM: DG HIP (WITH OR WITHOUT PELVIS) 2-3V RIGHT COMPARISON:  CT RIGHT hip 12/11/2019 FINDINGS: Osseous demineralization. Hip and SI joint spaces preserved. Again identified fractures of the RIGHT superior and inferior pubic rami. No new fracture, dislocation, or bone destruction. Enthesopathy at RIGHT greater trochanter. Atherosclerotic calcifications in pelvis. IMPRESSION: Again identified fractures of the RIGHT inferior and superior pubic rami. No new osseous abnormalities. Electronically Signed   By: Lavonia Dana M.D.   On: 12/19/2019 13:56     Discharge Exam: Vitals:   12/23/19 2132 12/24/19 0534  BP: (!) 92/48 (!) 101/42  Pulse: 71 (!) 57  Resp: 18 18  Temp: 98.3 F (36.8 C) 98.2 F (36.8 C)  SpO2: 92% (!) 88%   Vitals:   12/23/19 1839 12/23/19 2132 12/24/19 0500 12/24/19 0534  BP:  (!) 92/48  (!) 101/42  Pulse:  71  (!) 57  Resp:  18  18  Temp:  98.3 F (36.8 C)  98.2 F (36.8 C)  TempSrc:  Oral    SpO2:  92%  (!) 88%  Weight: 74 kg  73.3 kg   Height: _0  (1.575 m)       General: Pt is alert, awake, not in acute distress Cardiovascular: RRR, S1/S2 +, no rubs, no gallops Respiratory: CTA bilaterally, no wheezing, no rhonchi Abdominal: Soft, NT, ND, bowel sounds + Extremities: no edema, no cyanosis    The results of significant diagnostics from this hospitalization (including imaging, microbiology, ancillary and laboratory) are listed below for reference.     Microbiology: Recent Results (from the past 240 hour(s))  Respiratory Panel by RT PCR (Flu A&B, Covid) - Nasopharyngeal Swab     Status: None   Collection Time: 12/19/19 12:29 PM   Specimen: Nasopharyngeal Swab  Result Value Ref Range Status   SARS Coronavirus 2 by RT PCR NEGATIVE NEGATIVE Final    Comment: (NOTE) SARS-CoV-2 target nucleic acids are NOT DETECTED.  The SARS-CoV-2 RNA is  generally detectable in upper respiratoy specimens during the acute phase of infection. The lowest concentration of SARS-CoV-2 viral copies this assay can detect is 131 copies/mL. A negative result does not preclude SARS-Cov-2 infection and should not be used as the sole basis for treatment or other patient management decisions. A negative result may occur with  improper specimen collection/handling, submission of specimen other than nasopharyngeal swab, presence of viral mutation(s) within the areas targeted by this assay, and inadequate number of viral copies (<131 copies/mL). A negative result must be combined with clinical observations, patient history, and epidemiological information. The expected result is Negative.  Fact Sheet for Patients:  PinkCheek.be  Fact Sheet for Healthcare Providers:  GravelBags.it  This test is no t yet approved or cleared by the [place] FDA and  has been authorized for detection and/or diagnosis of SARS-CoV-2  by FDA under an Emergency Use Authorization (EUA). This EUA will remain  in effect (meaning this test can be used) for the duration of the COVID-19 declaration under Section 564(b)(1) of the Act, 21 U.S.C. section 360bbb-3(b)(1), unless the authorization is terminated or revoked sooner.     Influenza A by PCR NEGATIVE NEGATIVE Final   Influenza B by PCR NEGATIVE NEGATIVE Final    Comment: (NOTE) The Xpert Xpress SARS-CoV-2/FLU/RSV assay is intended as an aid in  the diagnosis of influenza from Nasopharyngeal swab specimens and  should not be used as a sole basis for treatment. Nasal washings and  aspirates are unacceptable for Xpert Xpress SARS-CoV-2/FLU/RSV  testing.  Fact Sheet for Patients: PinkCheek.be  Fact Sheet for Healthcare Providers: GravelBags.it  This test is not yet approved or cleared by the [place] FDA and  has been authorized for detection and/or diagnosis of SARS-CoV-2 by  FDA under an Emergency Use Authorization (EUA). This EUA will remain  in effect (meaning this test can be used) for the duration of the  Covid-19 declaration under Section 564(b)(1) of the Act, 21  U.S.C. section 360bbb-3(b)(1), unless the authorization is  terminated or revoked. Performed at [facility], [address], [place], [place] [address]   Respiratory Panel by RT PCR (Flu A&B, Covid) - Nasopharyngeal Swab     Status: None   Collection Time: 12/22/19 10:42 PM   Specimen: Nasopharyngeal Swab  Result Value Ref Range Status   SARS Coronavirus 2 by RT PCR NEGATIVE NEGATIVE Final    Comment: (NOTE) SARS-CoV-2 target nucleic acids are NOT DETECTED.  The SARS-CoV-2 RNA is generally detectable in upper respiratoy specimens during the acute phase of infection. The lowest concentration of SARS-CoV-2 viral copies this assay can detect is 131 copies/mL. A negative result does not preclude SARS-Cov-2 infection and should not be used as the sole basis for treatment or other patient management decisions. A negative result may occur with  improper specimen collection/handling, submission of specimen other than nasopharyngeal swab, presence of viral mutation(s) within the areas targeted by this assay, and inadequate number of viral copies (<131 copies/mL). A negative result must be combined with clinical observations, patient history, and epidemiological information. The expected result is Negative.  Fact Sheet for Patients:  PinkCheek.be  Fact Sheet for Healthcare Providers:  GravelBags.it  This test is no t yet approved or cleared by the [place] FDA and  has been authorized for detection and/or diagnosis of SARS-CoV-2 by FDA under an Emergency Use Authorization (EUA). This EUA will remain  in effect (meaning this test can be used) for the  duration of the COVID-19 declaration under Section 564(b)(1) of the Act, 21 U.S.C. section 360bbb-3(b)(1), unless the authorization is terminated or revoked sooner.     Influenza A by PCR NEGATIVE NEGATIVE Final   Influenza B by PCR NEGATIVE NEGATIVE Final    Comment: (NOTE) The Xpert Xpress SARS-CoV-2/FLU/RSV assay is intended as an aid in  the diagnosis of influenza from Nasopharyngeal swab specimens and  should not be used as a sole basis for treatment. Nasal washings and  aspirates are unacceptable for Xpert Xpress SARS-CoV-2/FLU/RSV  testing.  Fact Sheet for Patients: PinkCheek.be  Fact Sheet for Healthcare Providers: GravelBags.it  This test is not yet approved or cleared by the [place] FDA and  has been authorized for detection and/or diagnosis of SARS-CoV-2 by  FDA under an Emergency Use Authorization (EUA). This EUA will remain  in effect (meaning this test  can be used) for the duration of the  Covid-19 declaration under Section 564(b)(1) of the Act, 21  U.S.C. section 360bbb-3(b)(1), unless the authorization is  terminated or revoked. Performed at [facility], [place] [address] [place], [place] [address]      Labs: BNP (last 3 results) No results for input(s): BNP in the last 8760 hours. Basic Metabolic Panel: Recent Labs  Lab 12/19/19 1235 12/20/19 1330 12/22/19 1806 12/23/19 0319 12/24/19 0836  NA 135 137 137 141 139  K 3.9 3.9 4.1 3.6 3.7  CL 104 105 104 108 107  CO2 _0 GLUCOSE 185* 196* 170* 129* 104*  BUN 36* 27* 29* 24* 27*  CREATININE 1.04* 0.75 0.96 0.76 1.25*  CALCIUM 9.0 9.1 9.4 9.1 8.8*   Liver Function Tests: Recent Labs  Lab 12/19/19 1235 12/22/19 1806 12/24/19 0836  AST 23 35 25  ALT _1 ALKPHOS 361* 465* 417*  BILITOT 1.7* 2.0* 1.4*  PROT 6.6 6.4* 5.8*  ALBUMIN 3.2* 2.5* 2.3*   No results for input(s): LIPASE, AMYLASE in the last 168  hours. No results for input(s): AMMONIA in the last 168 hours. CBC: Recent Labs  Lab 12/19/19 1235 12/20/19 1330 12/22/19 1806 12/23/19 0319 12/24/19 0206  WBC 12.4* 13.9* 11.1* 11.9* 12.0*  NEUTROABS 10.1*  --  8.1*  --  8.4*  HGB 11.7* 11.1* 11.2* 10.3* 9.5*  HCT 35.6* 34.8* 35.5* 32.6* 29.6*  MCV 91.0 93.3 93.4 93.7 92.5  PLT 236 272 295 279 272   Cardiac Enzymes: No results for input(s): CKTOTAL, CKMB, CKMBINDEX, TROPONINI in the last 168 hours. BNP: Invalid input(s): POCBNP CBG: Recent Labs  Lab 12/23/19 0740 12/23/19 1330 12/23/19 1743 12/23/19 2130 12/24/19 0805  GLUCAP 144* 149* 173* 121* 104*   D-Dimer Recent Labs    12/22/19 1806  DDIMER 1.15*   Hgb A1c Recent Labs    12/23/19 0319  HGBA1C 6.3*   Lipid Profile No results for input(s): CHOL, HDL, LDLCALC, TRIG, CHOLHDL, LDLDIRECT in the last 72 hours. Thyroid function studies No results for input(s): TSH, T4TOTAL, T3FREE, THYROIDAB in the last 72 hours.  Invalid input(s): FREET3 Anemia work up No results for input(s): VITAMINB12, FOLATE, FERRITIN, TIBC, IRON, RETICCTPCT in the last 72 hours. Urinalysis    Component Value Date/Time   COLORURINE AMBER (A) 12/23/2019 0032   APPEARANCEUR CLOUDY (A) 12/23/2019 0032   LABSPEC >1.046 (H) 12/23/2019 0032   PHURINE 6.0 12/23/2019 0032   GLUCOSEU NEGATIVE 12/23/2019 0032   HGBUR NEGATIVE 12/23/2019 0032   BILIRUBINUR SMALL (A) 12/23/2019 0032   KETONESUR NEGATIVE 12/23/2019 0032   PROTEINUR NEGATIVE 12/23/2019 0032   NITRITE POSITIVE (A) 12/23/2019 0032   LEUKOCYTESUR TRACE (A) 12/23/2019 0032   Sepsis Labs Invalid input(s): PROCALCITONIN,  WBC,  LACTICIDVEN Microbiology Recent Results (from the past 240 hour(s))  Respiratory Panel by RT PCR (Flu A&B, Covid) - Nasopharyngeal Swab     Status: None   Collection Time: 12/19/19 12:29 PM   Specimen: Nasopharyngeal Swab  Result Value Ref Range Status   SARS Coronavirus 2 by RT PCR NEGATIVE NEGATIVE  Final    Comment: (NOTE) SARS-CoV-2 target nucleic acids are NOT DETECTED.  The SARS-CoV-2 RNA is generally detectable in upper respiratoy specimens during the acute phase of infection. The lowest concentration of SARS-CoV-2 viral copies this assay can detect is 131 copies/mL. A negative result does not preclude SARS-Cov-2 infection and should not be used as the sole basis for  treatment or other patient management decisions. A negative result may occur with  improper specimen collection/handling, submission of specimen other than nasopharyngeal swab, presence of viral mutation(s) within the areas targeted by this assay, and inadequate number of viral copies (<131 copies/mL). A negative result must be combined with clinical observations, patient history, and epidemiological information. The expected result is Negative.  Fact Sheet for Patients:  PinkCheek.be  Fact Sheet for Healthcare Providers:  GravelBags.it  This test is no t yet approved or cleared by the [place] FDA and  has been authorized for detection and/or diagnosis of SARS-CoV-2 by FDA under an Emergency Use Authorization (EUA). This EUA will remain  in effect (meaning this test can be used) for the duration of the COVID-19 declaration under Section 564(b)(1) of the Act, 21 U.S.C. section 360bbb-3(b)(1), unless the authorization is terminated or revoked sooner.     Influenza A by PCR NEGATIVE NEGATIVE Final   Influenza B by PCR NEGATIVE NEGATIVE Final    Comment: (NOTE) The Xpert Xpress SARS-CoV-2/FLU/RSV assay is intended as an aid in  the diagnosis of influenza from Nasopharyngeal swab specimens and  should not be used as a sole basis for treatment. Nasal washings and  aspirates are unacceptable for Xpert Xpress SARS-CoV-2/FLU/RSV  testing.  Fact Sheet for Patients: PinkCheek.be  Fact Sheet for Healthcare  Providers: GravelBags.it  This test is not yet approved or cleared by the [place] FDA and  has been authorized for detection and/or diagnosis of SARS-CoV-2 by  FDA under an Emergency Use Authorization (EUA). This EUA will remain  in effect (meaning this test can be used) for the duration of the  Covid-19 declaration under Section 564(b)(1) of the Act, 21  U.S.C. section 360bbb-3(b)(1), unless the authorization is  terminated or revoked. Performed at [facility], [address] [place], [place] [address]   Respiratory Panel by RT PCR (Flu A&B, Covid) - Nasopharyngeal Swab     Status: None   Collection Time: 12/22/19 10:42 PM   Specimen: Nasopharyngeal Swab  Result Value Ref Range Status   SARS Coronavirus 2 by RT PCR NEGATIVE NEGATIVE Final    Comment: (NOTE) SARS-CoV-2 target nucleic acids are NOT DETECTED.  The SARS-CoV-2 RNA is generally detectable in upper respiratoy specimens during the acute phase of infection. The lowest concentration of SARS-CoV-2 viral copies this assay can detect is 131 copies/mL. A negative result does not preclude SARS-Cov-2 infection and should not be used as the sole basis for treatment or other patient management decisions. A negative result may occur with  improper specimen collection/handling, submission of specimen other than nasopharyngeal swab, presence of viral mutation(s) within the areas targeted by this assay, and inadequate number of viral copies (<131 copies/mL). A negative result must be combined with clinical observations, patient history, and epidemiological information. The expected result is Negative.  Fact Sheet for Patients:  PinkCheek.be  Fact Sheet for Healthcare Providers:  GravelBags.it  This test is no t yet approved or cleared by the [place] FDA and  has been authorized for detection and/or diagnosis of SARS-CoV-2 by FDA  under an Emergency Use Authorization (EUA). This EUA will remain  in effect (meaning this test can be used) for the duration of the COVID-19 declaration under Section 564(b)(1) of the Act, 21 U.S.C. section 360bbb-3(b)(1), unless the authorization is terminated or revoked sooner.     Influenza A by PCR NEGATIVE NEGATIVE Final   Influenza B by PCR NEGATIVE NEGATIVE Final    Comment: (NOTE)  The Xpert Xpress SARS-CoV-2/FLU/RSV assay is intended as an aid in  the diagnosis of influenza from Nasopharyngeal swab specimens and  should not be used as a sole basis for treatment. Nasal washings and  aspirates are unacceptable for Xpert Xpress SARS-CoV-2/FLU/RSV  testing.  Fact Sheet for Patients: PinkCheek.be  Fact Sheet for Healthcare Providers: GravelBags.it  This test is not yet approved or cleared by the [place] FDA and  has been authorized for detection and/or diagnosis of SARS-CoV-2 by  FDA under an Emergency Use Authorization (EUA). This EUA will remain  in effect (meaning this test can be used) for the duration of the  Covid-19 declaration under Section 564(b)(1) of the Act, 21  U.S.C. section 360bbb-3(b)(1), unless the authorization is  terminated or revoked. Performed at [facility], [place] [address], [place], [place] [address]      Time coordinating discharge: Over 30 minutes  SIGNED:   Darliss Cheney, MD  [facility] 12/24/2019, 12:30 PM  If 7PM-7AM, please contact night-coverage [email or web address]

## 2019-12-24 NOTE — Evaluation (Signed)
Clinical/Bedside Swallow Evaluation Patient Details  Name: Kathryn Sloan MRN: [id number] Date of Birth: 1935-05-14  Today's Date: 12/24/2019 Time: SLP Start Time (ACUTE ONLY): 67 SLP Stop Time (ACUTE ONLY): 1543 SLP Time Calculation (min) (ACUTE ONLY): 13 min  Past Medical History:  Past Medical History:  Diagnosis Date  . Chronic systolic CHF (congestive heart failure) ([place])   . Diabetes mellitus without complication ([place])   . Hyperlipidemia associated with type 2 diabetes mellitus ([place])   . Hypertension associated with diabetes ([place])   . ICD (implantable cardioverter-defibrillator) in place   . LBBB (left bundle branch block)   . NICM (nonischemic cardiomyopathy) [facility])    Past Surgical History:  Past Surgical History:  Procedure Laterality Date  . TOTAL ABDOMINAL HYSTERECTOMY W/ BILATERAL SALPINGOOPHORECTOMY     HPI:  Kathryn Sloan  with past medical history of chronic systolic CHF, NICM s/p ICD, diabetes type 2, HTN, hyperlipidemia, chronic anticoagulation with Coumadin (? Hx of A-fib), dementia, history of breast cancer (s/p lumpectomy), and endometrial cancer s/p TAH/BSO. She also has a recent rami fracture secondary to fall 12/08/19. She presented to [facility] emergency department on 12/22/2019 with cough and right leg weakness. Daughter reports possible aspiration event at home. In the ED, spo2 was 87% on room air. Chest x-ray showed chronic elevation of the right hemidiaphragm with new small pleural effusions.    Assessment / Plan / Recommendation Clinical Impression  Pt demonstrates no signs of dysphagia or aspiration. Discussed basic aspiration precautions in setting or advanced age, dementia and recent hip fx. No SLP f/u needed will sign off.  SLP Visit Diagnosis: Dysphagia, oropharyngeal phase (R13.12)    Aspiration Risk  Mild aspiration risk    Diet Recommendation Regular;Thin liquid   Liquid Administration via: Cup;Straw Medication Administration: Whole meds with  liquid Supervision: Patient able to self feed Compensations: Slow rate;Small sips/bites Postural Changes: Seated upright at 90 degrees    Other  Recommendations Oral Care Recommendations: Oral care BID   Follow up Recommendations        Frequency and Duration            Prognosis        Swallow Study   General HPI: Kathryn Sloan  with past medical history of chronic systolic CHF, NICM s/p ICD, diabetes type 2, HTN, hyperlipidemia, chronic anticoagulation with Coumadin (? Hx of A-fib), dementia, history of breast cancer (s/p lumpectomy), and endometrial cancer s/p TAH/BSO. She also has a recent rami fracture secondary to fall 12/08/19. She presented to the [facility] emergency department on 12/22/2019 with cough and right leg weakness. Daughter reports possible aspiration event at home. In the ED, spo2 was 87% on room air. Chest x-ray showed chronic elevation of the right hemidiaphragm with new small pleural effusions.  Type of Study: Bedside Swallow Evaluation Previous Swallow Assessment: none Diet Prior to this Study: Dysphagia 3 (soft);Thin liquids Temperature Spikes Noted: No Respiratory Status: Room air History of Recent Intubation: No Behavior/Cognition: Alert;Cooperative;Pleasant mood Oral Cavity Assessment: Within Functional Limits Oral Care Completed by SLP: No Oral Cavity - Dentition: Dentures, top;Dentures, bottom Vision: Functional for self-feeding Self-Feeding Abilities: Able to feed self Patient Positioning: Upright in bed Baseline Vocal Quality: Normal Volitional Cough: Strong Volitional Swallow: Able to elicit    Oral/Motor/Sensory Function     Ice Chips     Thin Liquid Thin Liquid: Within functional limits Presentation: Straw    Nectar Thick Nectar Thick Liquid: Not tested   Honey Thick Honey Thick Liquid:  Not tested   Puree Puree: Within functional limits Presentation: Spoon   Solid     Solid: Within functional limits     Kathryn Baltimore, MA [place] Pager [phone] Office [phone]  Lynann Beaver 12/24/2019,3:46 PM

## 2019-12-24 NOTE — Progress Notes (Signed)
SATURATION QUALIFICATIONS: (This note is used to comply with regulatory documentation for home oxygen)  Patient Saturations on Room Air at Rest = 86%    Patient Saturations on 2 Liters of oxygen at rest = 95%  Please briefly explain why patient needs home oxygen: Pt qualifies for Home O2 d/t pneumonia and SOB

## 2019-12-24 NOTE — Care Management Important Message (Signed)
Important Message  Patient Details  Name: Kathryn Sloan MRN: [id number] Date of Birth: April 07, 1935   Medicare Important Message Given:  Yes     Shemiah Rosch 12/24/2019, 3:11 PM

## 2019-12-24 NOTE — Discharge Instructions (Signed)
Community-Acquired Pneumonia, Adult Pneumonia is an infection of the lungs. It causes swelling in the airways of the lungs. Mucus and fluid may also build up inside the airways. One type of pneumonia can happen while a person is in a hospital. A different type can happen when a person is not in a hospital (community-acquired pneumonia).  What are the causes?  This condition is caused by germs (viruses, bacteria, or fungi). Some types of germs can be passed from one person to another. This can happen when you breathe in droplets from the cough or sneeze of an infected person. What increases the risk? You are more likely to develop this condition if you:  Have a long-term (chronic) disease, such as: ? Chronic obstructive pulmonary disease (COPD). ? Asthma. ? Cystic fibrosis. ? Congestive heart failure. ? Diabetes. ? Kidney disease.  Have HIV.  Have sickle cell disease.  Have had your spleen removed.  Do not take good care of your teeth and mouth (poor dental hygiene).  Have a medical condition that increases the risk of breathing in droplets from your own mouth and nose.  Have a weakened body defense system (immune system).  Are a smoker.  Travel to areas where the germs that cause this illness are common.  Are around certain animals or the places they live. What are the signs or symptoms?  A dry cough.  A wet (productive) cough.  Fever.  Sweating.  Chest pain. This often happens when breathing deeply or coughing.  Fast breathing or trouble breathing.  Shortness of breath.  Shaking chills.  Feeling tired (fatigue).  Muscle aches. How is this treated? Treatment for this condition depends on many things. Most adults can be treated at home. In some cases, treatment must happen in a hospital. Treatment may include:  Medicines given by mouth or through an IV tube.  Being given extra oxygen.  Respiratory therapy. In rare cases, treatment for very bad pneumonia  may include:  Using a machine to help you breathe.  Having a procedure to remove fluid from around your lungs. Follow these instructions at home: Medicines  Take over-the-counter and prescription medicines only as told by your doctor. ? Only take cough medicine if you are losing sleep.  If you were prescribed an antibiotic medicine, take it as told by your doctor. Do not stop taking the antibiotic even if you start to feel better. General instructions   Sleep with your head and neck raised (elevated). You can do this by sleeping in a recliner or by putting a few pillows under your head.  Rest as needed. Get at least 8 hours of sleep each night.  Drink enough water to keep your pee (urine) pale yellow.  Eat a healthy diet that includes plenty of vegetables, fruits, whole grains, low-fat dairy products, and lean protein.  Do not use any products that contain nicotine or tobacco. These include cigarettes, e-cigarettes, and chewing tobacco. If you need help quitting, ask your doctor.  Keep all follow-up visits as told by your doctor. This is important. How is this prevented? A shot (vaccine) can help prevent pneumonia. Shots are often suggested for:  People older than 82 years of age.  People older than 82 years of age who: ? Are having cancer treatment. ? Have long-term (chronic) lung disease. ? Have problems with their body's defense system. You may also prevent pneumonia if you take these actions:  Get the flu (influenza) shot every year.  Go to the dentist as   often as told.  Wash your hands often. If you cannot use soap and water, use hand sanitizer. Contact a doctor if:  You have a fever.  You lose sleep because your cough medicine does not help. Get help right away if:  You are short of breath and it gets worse.  You have more chest pain.  Your sickness gets worse. This is very serious if: ? You are an older adult. ? Your body's defense system is weak.  You  cough up blood. Summary  Pneumonia is an infection of the lungs.  Most adults can be treated at home. Some will need treatment in a hospital.  Drink enough water to keep your pee pale yellow.  Get at least 8 hours of sleep each night. This information is not intended to replace advice given to you by your health care provider. Make sure you discuss any questions you have with your health care provider. Document Revised: 05/20/2018 Document Reviewed: 09/25/2017 Elsevier Patient Education  2020 Elsevier Inc.  

## 2019-12-24 NOTE — TOC Progression Note (Signed)
Transition of Care [facility]) - Progression Note    Patient Details  Name: Kathryn Sloan MRN: [id number] Date of Birth: Aug 27, 1935  Transition of Care [facility]) CM/SW [place], [place] Phone Number: 12/24/2019, 2:08 PM  Clinical Narrative:     Oxygen set up through [place] and [facility] set up through [place].        Expected Discharge Plan and Services           Expected Discharge Date: 12/24/19                                     Social Determinants of Health (SDOH) Interventions    Readmission Risk Interventions No flowsheet data found.

## 2019-12-27 DIAGNOSIS — Z7189 Other specified counseling: Secondary | ICD-10-CM

## 2019-12-27 DIAGNOSIS — F0390 Unspecified dementia without behavioral disturbance: Secondary | ICD-10-CM

## 2019-12-27 DIAGNOSIS — Z515 Encounter for palliative care: Secondary | ICD-10-CM

## 2019-12-27 DIAGNOSIS — J188 Other pneumonia, unspecified organism: Secondary | ICD-10-CM

## 2019-12-27 DIAGNOSIS — J189 Pneumonia, unspecified organism: Secondary | ICD-10-CM

## 2020-01-05 ENCOUNTER — Telehealth: Payer: Self-pay

## 2020-01-05 NOTE — Telephone Encounter (Signed)
Phone call placed to check in on patient. VM left with call back information. Follow up visit previously scheduled for 01/20/20

## 2020-01-20 ENCOUNTER — Other Ambulatory Visit: Payer: Medicare Other | Admitting: Nurse Practitioner

## 2020-01-20 ENCOUNTER — Encounter: Payer: Self-pay | Admitting: Nurse Practitioner

## 2020-01-20 DIAGNOSIS — F0391 Unspecified dementia with behavioral disturbance: Secondary | ICD-10-CM

## 2020-01-20 DIAGNOSIS — Z515 Encounter for palliative care: Secondary | ICD-10-CM

## 2020-01-20 NOTE — Progress Notes (Signed)
[facility] Consult Note Telephone: [phone]  Fax: [phone]  PATIENT NAME: Kathryn Sloan DOB: 18-Jun-1935 MRN: [id number]  PRIMARY CARE PROVIDER:   [facility]  REFERRING PROVIDER:  [facility] [place],  [place] [address]   RESPONSIBLE PARTY:Janet Rinella daughter [phone] or [phone]  RECOMMENDATIONS and PLAN: 1.MOQ:HUTM DNR/ in [place]; will review case with Hospice physician for eligibility, once determined, will contact [name] for further complex decision making to proceed with Hospice services or schedule f/u with PC  2.Palliative care encounter; Palliative medicine team will continue to support patient, patient's family, and medical team. Visit consisted of counseling and education dealing with the complex and emotionally intense issues of symptom management and palliative care in the setting of serious and potentially life-threatening illness\  I spent 65 minutes providing this consultation,  from 11:15am to 12:20pm. More than 50% of the time in this consultation was spent coordinating communication.   HISTORY OF PRESENT ILLNESS:  Kathryn Sloan is a 84 y.o. year old female with multiple medical problems including dementia, intraductal carcinoma, systolic CHF with ICD, DM HLD, HTN. In-person visit for Kathryn Sloan and her daughter [name] for follow-up chronic disease progression of dementia, weight loss. At present Kathryn Sloan is sitting in a recliner in her living room. Kathryn Sloan does appear confused, smiling. [name] and I talked about purpose of Palliative care visit and in agreement. We talked about how Kathryn Sloan has been doing. [name] endorses Kathryn Sloan has been more confused not knowing who she is as she is her primary caregiver. [name] endorses her behaviors are becoming a little bit more difficult to manage at home as she does have periods where she is  paranoid. We talked about recent hospitalizations. We talked about chronic disease progression of dementia. We talked about symptoms of pain but she does have intermittently for what she is taking Tylenol for. We talked about her appetite which is very declined. [name] endorses she continues to lose weight as now she is laying her everyday. We talked about medical goals of care with focus on comfort. We talked about follow up hospital visit at [facility] which has not as of yet been scheduled. I did call the [facility], assisted [name] and getting a follow-up Hospital appointment. We talked about increase in behavior she currently take Seroquel at bedtime. We talked about the possibility of adding an a.m. dose and increasing the p.m. dose. [name] endorsed said she will further discuss with [facility]. We talked about Hospice benefit through [facility] program. We talked about what services are provided. [name] and agreements have case review by [facility] for eligibility. We talked about quality of life. We talked about different techniques [name] and assisting with her care. We talked about self care, caregiver fatigue and burnout with coping strategies. We talked about role of Palliative care in plan of care. Therapeutic listening and emotional support provided. Discuss with [name] once it's determine if Kathryn Sloan is Hospice eligible will recontact for further discussion if wishes are to proceed with hospice or to reschedule a follow-up Palliative care visit. [name] in agreement. Therapeutical listening, emotional support provided. Questions answered to satisfaction. Contact information. [name] express she was thankful for palliative care visit and support. Kathryn Sloan was cooperative with assessment. I called Amy Cobb, RN at [facility], update given  Palliative Care was asked to help to continue to address  goals of care.   CODE STATUS: DNR  PPS: 40% HOSPICE  ELIGIBILITY/DIAGNOSIS: TBD  PAST MEDICAL HISTORY:  Past Medical History:  Diagnosis Date  . Chronic systolic CHF (congestive heart failure) ([place])   . Diabetes mellitus without complication ([place])   . Hyperlipidemia associated with type 2 diabetes mellitus ([place])   . Hypertension associated with diabetes ([place])   . ICD (implantable cardioverter-defibrillator) in place   . LBBB (left bundle branch block)   . NICM (nonischemic cardiomyopathy) ([place])     SOCIAL HX:  Social History   Tobacco Use  . Smoking status: Never Smoker  . Smokeless tobacco: Never Used  Substance Use Topics  . Alcohol use: Never    ALLERGIES: No Known Allergies   PERTINENT MEDICATIONS:  Outpatient Encounter Medications as of 01/20/2020  Medication Sig  . Ascorbic Acid (VITAMIN C) 1000 MG tablet Take 1,000 mg by mouth daily.  Marland Kitchen aspirin EC 81 MG tablet Take 1 tablet (81 mg total) by mouth daily. Swallow whole.  Marland Kitchen atorvastatin (LIPITOR) 40 MG tablet Take 40 mg by mouth daily.   . carvedilol (COREG) 25 MG tablet Take 25 mg by mouth 2 (two) times daily with a meal.   . diltiazem (CARDIZEM SR) 60 MG 12 hr capsule Take 60 mg by mouth at bedtime.   Marland Kitchen ENTRESTO 49-51 MG Take 1 tablet by mouth at bedtime.   . fexofenadine (ALLEGRA) 180 MG tablet Take 180 mg by mouth daily.   Marland Kitchen HYDROcodone-acetaminophen (NORCO/VICODIN) 5-325 MG tablet Take 1 tablet by mouth every 6 (six) hours as needed for severe pain.  Marland Kitchen insulin glargine (LANTUS) 100 UNIT/ML injection Inject 20 Units into the skin at bedtime.   . metFORMIN (GLUCOPHAGE-XR) 500 MG 24 hr tablet Take 500 mg by mouth in the morning and at bedtime.  . mometasone (NASONEX) 50 MCG/ACT nasal spray Place 2 sprays into the nose daily as needed (allergies).   . montelukast (SINGULAIR) 10 MG tablet Take 10 mg by mouth at bedtime.  . ondansetron (ZOFRAN) 4 MG tablet Take 4 mg by mouth every 8 (eight) hours as needed.  . pseudoephedrine (SUDAFED) 120 MG 12 hr tablet Take 120 mg by mouth  2 (two) times daily.  . QUEtiapine (SEROQUEL) 25 MG tablet Take 25 mg by mouth at bedtime.  Marland Kitchen spironolactone (ALDACTONE) 25 MG tablet Take 25 mg by mouth 2 (two) times daily.  Marland Kitchen venlafaxine XR (EFFEXOR-XR) 75 MG 24 hr capsule Take 75 mg by mouth daily with breakfast.   No facility-administered encounter medications on file as of 01/20/2020.    PHYSICAL EXAM:   General: NAD, frail appearing, confused pleasant female Cardiovascular: regular rate and rhythm Pulmonary: clear ant fields Neurological: generalized weakness; walks with walker  [place] Sine Ihor Gully, NP

## 2020-01-21 ENCOUNTER — Other Ambulatory Visit: Payer: Self-pay

## 2020-01-25 ENCOUNTER — Telehealth: Payer: Self-pay | Admitting: Nurse Practitioner

## 2020-01-25 NOTE — Telephone Encounter (Signed)
I called [name], Ms Green's daughter. We talked about update on Hospice eligibility for which the Hospice Physicians felt she was eligible for Services. [name] endorses her wishes are to proceed with Hospice Services. [name] talked about increase in agitation with Ms Gaffey as she was in the yard last night and fell, very difficult to get back in the house. We Revisited increasing the Seroquel at bedtime to 50 mg and had Seroquel 25 mg QAM for behaviors. Discuss with [name] will contact [employer] in call prescription in. We talked about coping strategies. Therapeutic listening and emotional support provided. Contact information. Questions answered to satisfaction.  Total time spent 25 minutes  Documentation 5 minutes  Phone discussion 20 minutes  Call Rx; [facility]  Seroquel 25 mg QAM#30/no rf Seroquel 50 mg qhs #30/no rf  I called [facility], updated hospice eligibility, verbal order received from [place] for proceeding on with Hospice Services. I have updated on Seroquel increase

## 2020-07-04 ENCOUNTER — Emergency Department (HOSPITAL_COMMUNITY)
Admission: EM | Admit: 2020-07-04 | Discharge: 2020-07-04 | Disposition: A | Discharge status: Home / Self Care | Attending: Emergency Medicine | Admitting: Emergency Medicine

## 2020-07-04 ENCOUNTER — Other Ambulatory Visit: Payer: Self-pay

## 2020-07-04 DIAGNOSIS — Z79899 Other long term (current) drug therapy: Secondary | ICD-10-CM | POA: Insufficient documentation

## 2020-07-04 DIAGNOSIS — X31XXXA Exposure to excessive natural cold, initial encounter: Secondary | ICD-10-CM | POA: Diagnosis not present

## 2020-07-04 DIAGNOSIS — E119 Type 2 diabetes mellitus without complications: Secondary | ICD-10-CM | POA: Diagnosis not present

## 2020-07-04 DIAGNOSIS — I5022 Chronic systolic (congestive) heart failure: Secondary | ICD-10-CM | POA: Insufficient documentation

## 2020-07-04 DIAGNOSIS — I110 Hypertensive heart disease with heart failure: Secondary | ICD-10-CM | POA: Diagnosis not present

## 2020-07-04 DIAGNOSIS — F0390 Unspecified dementia without behavioral disturbance: Secondary | ICD-10-CM | POA: Insufficient documentation

## 2020-07-04 DIAGNOSIS — T699XXA Effect of reduced temperature, unspecified, initial encounter: Secondary | ICD-10-CM | POA: Insufficient documentation

## 2020-07-04 NOTE — ED Triage Notes (Signed)
Pt ccems from home . Pt lives with her daughter. She is seen by hospice and has a hx of dementia. Pt went outside during the night and was found in the rain. At some point while outside pt slipped in the grass. Pt has small skin tear to left wrist and slight bruising to forehead. Daughter thinks she was outside roughly one house.    Pt doesn't complain of pain. Alert and oriented to self and place.

## 2020-07-04 NOTE — Discharge Instructions (Addendum)
Please return to the ER if she has any new pain in her arms, legs chest or abdomen, or if she has any new change in her mental status over the next 24 hours.

## 2020-07-04 NOTE — ED Provider Notes (Signed)
[facility] Provider Note   CSN: [id number] Arrival date & time: 07/04/20  0058     History Chief Complaint  Patient presents with  . Cold Exposure   Level 5 caveat due to dementia Kathryn Sloan is a 85 y.o. female.  The history is provided by the patient.      Patient with history of CHF, diabetes, hypertension, dementia presents for possible fall.  Apparently escaped from her home and was found outside in the rain.  She may have fallen at some point because she has a small skin tear to the left arm As estimated the patient was outside for 1 hour. Patient has no complaints at this time. Past Medical History:  Diagnosis Date  . Chronic systolic CHF (congestive heart failure) ([place])   . Diabetes mellitus without complication ([place])   . Hyperlipidemia associated with type 2 diabetes mellitus ([place])   . Hypertension associated with diabetes ([place])   . ICD (implantable cardioverter-defibrillator) in place   . LBBB (left bundle branch block)   . NICM (nonischemic cardiomyopathy) [facility])     Patient Active Problem List   Diagnosis Date Noted  . Multifocal pneumonia   . Dementia without behavioral disturbance ([place])   . Palliative care by specialist   . Goals of care, counseling/discussion   . Acute respiratory failure with hypoxia ([place]) 12/22/2019  . Closed fracture of multiple pubic rami ([place]) 12/22/2019  . Supratherapeutic INR 12/22/2019  . Chronic systolic CHF (congestive heart failure) ([place])   . Diabetes mellitus without complication ([place])   . Hyperlipidemia associated with type 2 diabetes mellitus ([place])   . Hypertension associated with diabetes ([place])   . ICD (implantable cardioverter-defibrillator) in place   . Cough with hemoptysis 12/19/2019    Past Surgical History:  Procedure Laterality Date  . TOTAL ABDOMINAL HYSTERECTOMY W/ BILATERAL SALPINGOOPHORECTOMY       OB History   No obstetric history on file.     Family History  Problem  Relation Age of Onset  . Heart failure Mother   . Lung cancer Father     Social History   Tobacco Use  . Smoking status: Never Smoker  . Smokeless tobacco: Never Used  Substance Use Topics  . Alcohol use: Never  . Drug use: Never    Home Medications Prior to Admission medications   Medication Sig Start Date End Date Taking? Authorizing Provider  Ascorbic Acid (VITAMIN C) 1000 MG tablet Take 1,000 mg by mouth daily.    [provider]  atorvastatin (LIPITOR) 40 MG tablet Take 40 mg by mouth daily.  06/28/11   [provider]  carvedilol (COREG) 25 MG tablet Take 25 mg by mouth 2 (two) times daily with a meal.     [provider]  diltiazem (CARDIZEM SR) 60 MG 12 hr capsule Take 60 mg by mouth at bedtime.  12/07/19   [provider]  ENTRESTO 49-51 MG Take 1 tablet by mouth at bedtime.  12/07/19   [provider]  fexofenadine (ALLEGRA) 180 MG tablet Take 180 mg by mouth daily.     [provider]  HYDROcodone-acetaminophen (NORCO/VICODIN) 5-325 MG tablet Take 1 tablet by mouth every 6 (six) hours as needed for severe pain. 12/20/19   Manuella Ghazi, Pratik D, DO  insulin glargine (LANTUS) 100 UNIT/ML injection Inject 20 Units into the skin at bedtime.     [provider]  metFORMIN (GLUCOPHAGE-XR) 500 MG 24 hr tablet Take 500 mg by mouth  in the morning and at bedtime.    [provider]  mometasone (NASONEX) 50 MCG/ACT nasal spray Place 2 sprays into the nose daily as needed (allergies).  08/05/19   [provider]  montelukast (SINGULAIR) 10 MG tablet Take 10 mg by mouth at bedtime.    [provider]  ondansetron (ZOFRAN) 4 MG tablet Take 4 mg by mouth every 8 (eight) hours as needed. 12/21/19   [provider]  pseudoephedrine (SUDAFED) 120 MG 12 hr tablet Take 120 mg by mouth 2 (two) times daily.    [provider]  QUEtiapine (SEROQUEL) 25 MG tablet Take 25 mg by mouth at bedtime. 12/07/19    [provider]  spironolactone (ALDACTONE) 25 MG tablet Take 25 mg by mouth 2 (two) times daily.    [provider]  venlafaxine XR (EFFEXOR-XR) 75 MG 24 hr capsule Take 75 mg by mouth daily with breakfast.    [provider]    Allergies    Patient has no known allergies.  Review of Systems   Review of Systems  Unable to perform ROS: Dementia    Physical Exam Updated Vital Signs BP 109/73   Pulse 61   Temp 97.7 F (36.5 C) (Rectal)   Resp 13   Ht 1.575 m (5\' 2" )   Wt 74 kg   SpO2 97%   BMI 29.84 kg/m   Physical Exam  CONSTITUTIONAL: Elderly, no acute distress HEAD: Small bruise noted to forehead that appears old, no other signs of trauma EYES: EOMI/PERRL ENMT: Mucous membranes moist, no visible trauma NECK: supple no meningeal signs SPINE/BACK:entire spine nontender, no bruising/crepitance/stepoffs noted to spine CV: S1/S2 noted LUNGS: Lungs are clear to auscultation bilaterally, no apparent distress Chest-no tenderness ABDOMEN: soft, nontender, no rebound or guarding, bowel sounds noted throughout abdomen, no bruising GU:no cva tenderness NEURO: Pt is awake/alert, moves all extremitiesx4.  No facial droop.  Pleasantly demented EXTREMITIES: pulses normal/equal, full ROM, small skin tear to left forearm.  Scattered bruising throughout her extremities that appear chronic.  All extremities/joints palpated/ranged and nontender.  Full range of motion of both hips without difficulty SKIN: warm, color normal  ED Results / Procedures / Treatments   Labs (all labs ordered are listed, but only abnormal results are displayed) Labs Reviewed - No data to display  EKG EKG Interpretation  Date/Time:  Tuesday Jul 04 2020 01:09:38 EDT Ventricular Rate:  61 PR Interval:  131 QRS Duration: 144 QT Interval:  506 QTC Calculation: 510 R Axis:   -79 Text Interpretation: pacemaker rhythm No significant change since last tracing Confirmed by Ripley Fraise [phone]) on 07/04/2020 1:45:19 AM   Radiology No results found.  Procedures Procedures   Medications Ordered in ED Medications - No data to display  ED Course  I have reviewed the triage vital signs and the nursing notes.  Pertinent labs & imaging results that were available during my care of the patient were reviewed by me and considered in my medical decision making (see chart for details).    MDM Rules/Calculators/A&P                          2:00 AM Patient presents via EMS after being found outside in the rain.  Other than a small skin tear to her left forearm, no other signs of acute traumatic injury.  Patient is awake and alert and smiling.  She has no pain complaints.  Will defer work-up  for now until family arrives 2:54 AM Patient's daughter Kathryn Sloan has arrived She confirms a story that the patient did escape the house and was found outside.  She does not think she was there in the rain for more than an hour.  Patient has had these issues before with escaping from the house Daughter reports that today she started having more symptoms of sundowning earlier in the day than usual. Patient is now her baseline.  There are no signs of any acute traumatic injury Daughter would like to take the patient home, as she has follow-up with hospice nurse later in the morning Final Clinical Impression(s) / ED Diagnoses Final diagnoses:  Cold exposure, initial encounter    Rx / DC Orders ED Discharge Orders    None       Ripley Fraise, MD 07/04/20 [phone]

## 2020-10-12 DEATH — deceased

## 2022-01-06 IMAGING — CT CT L SPINE W/O CM
3 of 4 series · 11 of 33 positions shown, 13 images · non-contrast
Comparison: None.

CLINICAL DATA: Back pain.  Trauma 3 weeks ago.

EXAM:
CT LUMBAR SPINE WITHOUT CONTRAST
TECHNIQUE: Multidetector CT imaging of the lumbar spine was performed without
intravenous contrast administration. Multiplanar CT image
reconstructions were also generated.

[Series 5: l spine soft · axial · 0.30mm/px · z∈[+568,+718]mm · 3 of 122 slices shown, 4 images]
[im 28/122  soft-tissue]
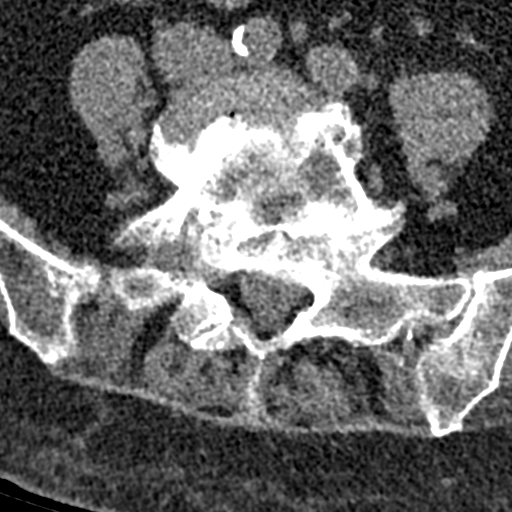
[im 28/122  bone]
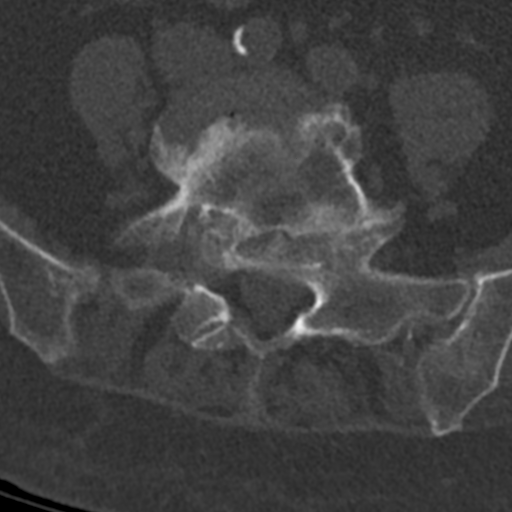
[im 66/122  bone]
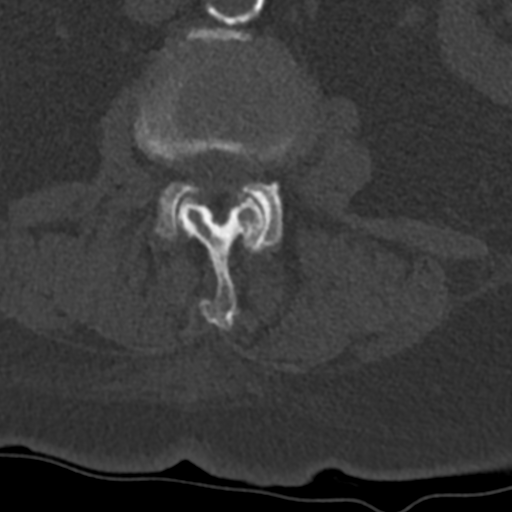
[im 103/122  bone]
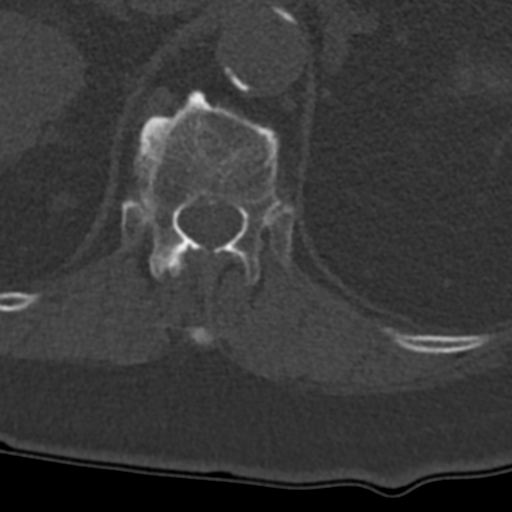

[Series 6: sagittal bone · sagittal · 0.31mm/px · 5 of 65 slices shown, 6 images]
[im 22/65  bone]
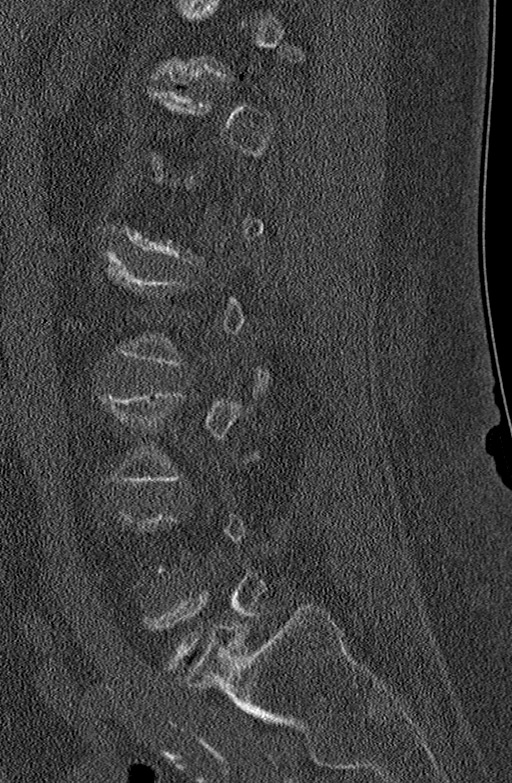
[im 27/65  bone]
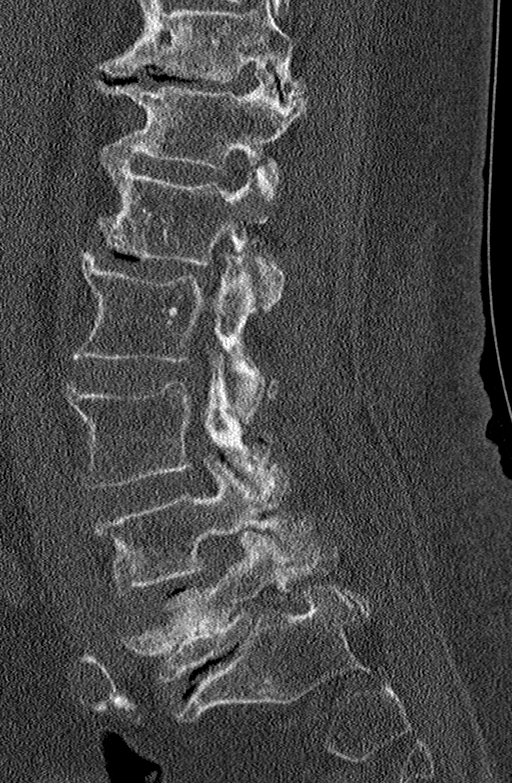
[im 33/65  soft-tissue]
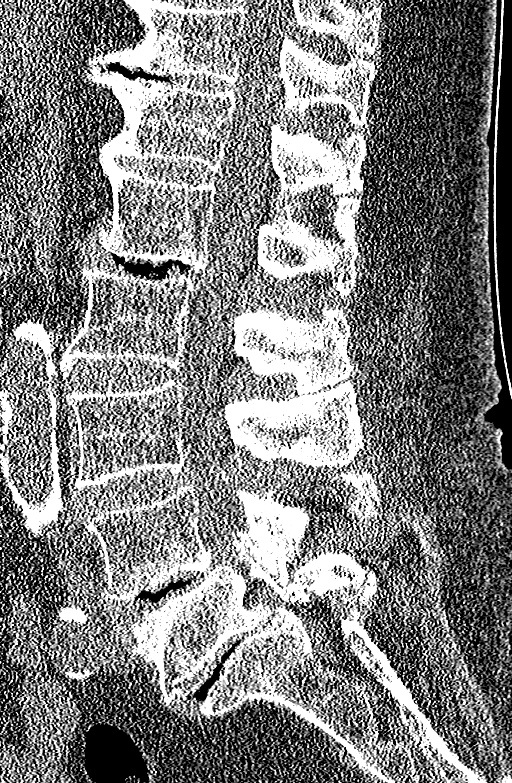
[im 33/65  bone]
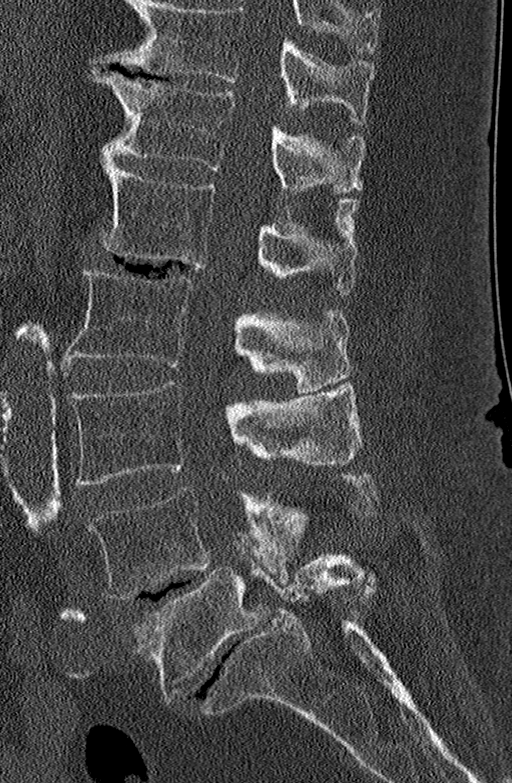
[im 38/65  bone]
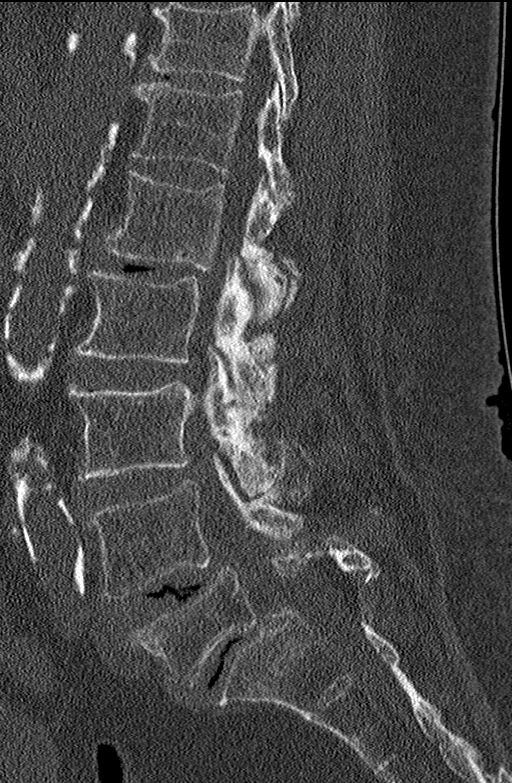
[im 43/65  bone]
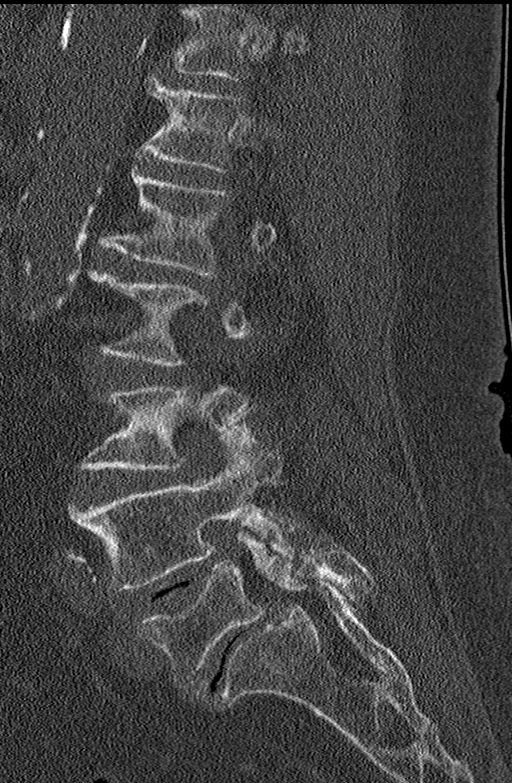

[Series 7: coronal bone · coronal · 0.28mm/px · 3 of 84 slices shown]
[im 21/84  bone]
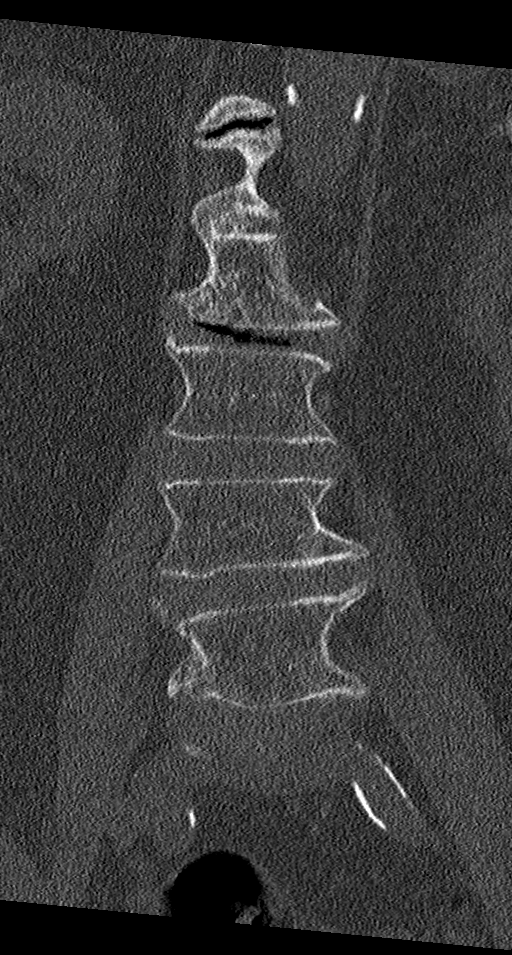
[im 42/84  bone]
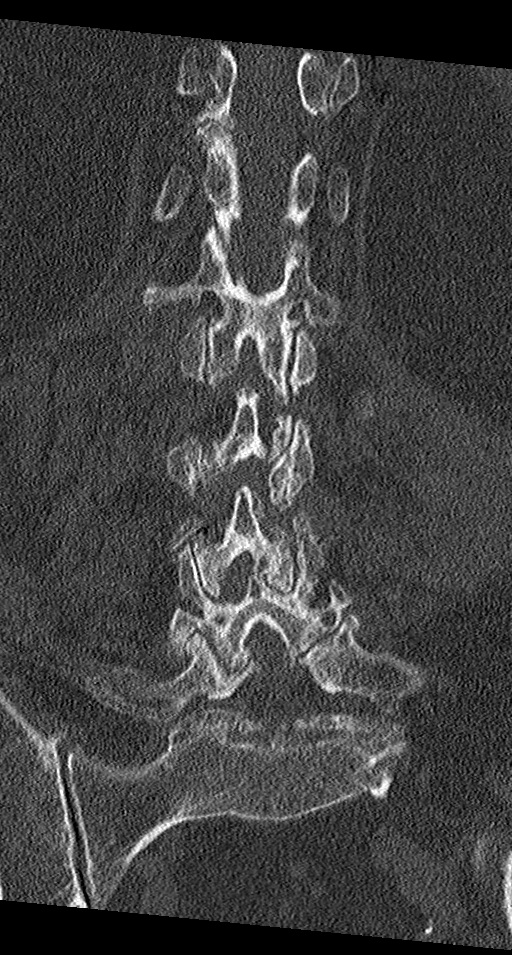
[im 63/84  bone]
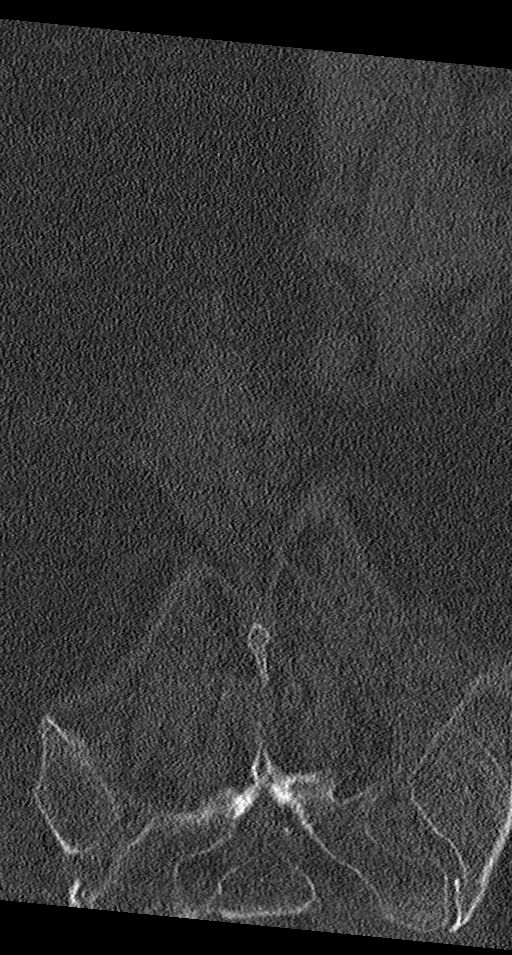

[11 of 33 positions shown; findings below may reference images not displayed]

FINDINGS: Segmentation: There are five lumbar type vertebral bodies. The last
full intervertebral disc space is labeled L5-S1.

Alignment: Grade 1 spondylolisthesis at L5 due to bilateral pars
defects. There is also degenerative anterolisthesis of L4. Mild
posterior listhesis of L1.

Vertebrae: Age related osteoporosis but no acute fracture or
worrisome bone lesions.

Paraspinal and other soft tissues: Advanced aortic calcifications
but no aneurysm. No retroperitoneal mass or adenopathy.

Disc levels: T12-L1: No significant findings.

L1-2: Bulging annulus and moderate facet disease but no significant
spinal or foraminal stenosis.

L2-3: Bulging annulus and advanced facet disease contributing to
mild spinal and bilateral lateral recess stenosis. No significant
foraminal stenosis.

L3-4: Right-sided laminectomy defect is noted. Diffuse bulging
annulus and advanced facet disease but no significant spinal or
foraminal stenosis.

L4-5: Advanced disc disease and facet disease without significant
spinal or foraminal stenosis.

L5-S1: Bulging uncovered disc and severe facet disease. Mild spinal,
lateral recess and mild to moderate foraminal stenosis bilaterally.
IMPRESSION: 1. Grade 1 spondylolisthesis at L5 due to bilateral pars defects.
There is also degenerative anterolisthesis of L4.
2. Mild spinal, lateral recess and mild to moderate foraminal
stenosis bilaterally at L5-S1.
3. Mild spinal and bilateral lateral recess stenosis at L2-3.
4. Right-sided laminectomy defect at L3-4.
5. Aortic atherosclerosis.

Aortic Atherosclerosis ([id number]).
